# Patient Record
Sex: Female | Born: 1960 | Race: White | Hispanic: No | Smoking: Former smoker
Health system: Southern US, Community
[De-identification: ages and names within clinical notes are randomized; demographics above are authoritative.]

## PROBLEM LIST (undated history)

## (undated) DIAGNOSIS — E063 Autoimmune thyroiditis: Secondary | ICD-10-CM

## (undated) DIAGNOSIS — M797 Fibromyalgia: Secondary | ICD-10-CM

## (undated) DIAGNOSIS — G629 Polyneuropathy, unspecified: Secondary | ICD-10-CM

---
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
## 1992-03-17 HISTORY — PX: ABDOMINAL HYSTERECTOMY: SHX81

## 1998-01-03 ENCOUNTER — Encounter: Admission: RE | Admit: 1998-01-03 | Discharge: 1998-04-03 | Payer: Self-pay | Admitting: Family Medicine

## 1999-03-18 HISTORY — PX: TOTAL THYROIDECTOMY: SHX2547

## 1999-08-20 ENCOUNTER — Encounter: Payer: Self-pay | Admitting: Endocrinology

## 1999-08-20 ENCOUNTER — Ambulatory Visit (HOSPITAL_COMMUNITY): Admission: RE | Admit: 1999-08-20 | Discharge: 1999-08-20 | Payer: Self-pay | Admitting: Endocrinology

## 1999-11-13 ENCOUNTER — Ambulatory Visit (HOSPITAL_COMMUNITY): Admission: RE | Admit: 1999-11-13 | Discharge: 1999-11-13 | Payer: Self-pay | Admitting: Endocrinology

## 1999-11-13 ENCOUNTER — Encounter: Payer: Self-pay | Admitting: Endocrinology

## 1999-12-20 ENCOUNTER — Encounter: Payer: Self-pay | Admitting: Surgery

## 1999-12-26 ENCOUNTER — Observation Stay (HOSPITAL_COMMUNITY): Admission: RE | Admit: 1999-12-26 | Discharge: 1999-12-27 | Payer: Self-pay | Admitting: Surgery

## 1999-12-26 ENCOUNTER — Encounter (INDEPENDENT_AMBULATORY_CARE_PROVIDER_SITE_OTHER): Payer: Self-pay | Admitting: Specialist

## 2000-03-17 HISTORY — PX: CARPAL TUNNEL WITH CUBITAL TUNNEL: SHX5608

## 2000-11-26 ENCOUNTER — Encounter: Payer: Self-pay | Admitting: Family Medicine

## 2000-11-26 ENCOUNTER — Encounter: Admission: RE | Admit: 2000-11-26 | Discharge: 2000-11-26 | Payer: Self-pay | Admitting: Family Medicine

## 2002-12-22 ENCOUNTER — Encounter: Payer: Self-pay | Admitting: Internal Medicine

## 2002-12-22 ENCOUNTER — Encounter: Admission: RE | Admit: 2002-12-22 | Discharge: 2002-12-22 | Payer: Self-pay | Admitting: Internal Medicine

## 2003-01-04 ENCOUNTER — Encounter: Payer: Self-pay | Admitting: Internal Medicine

## 2003-01-04 ENCOUNTER — Encounter: Admission: RE | Admit: 2003-01-04 | Discharge: 2003-01-04 | Payer: Self-pay | Admitting: Internal Medicine

## 2003-01-16 ENCOUNTER — Ambulatory Visit (HOSPITAL_COMMUNITY): Admission: RE | Admit: 2003-01-16 | Discharge: 2003-01-16 | Payer: Self-pay | Admitting: Gastroenterology

## 2003-06-16 ENCOUNTER — Encounter: Admission: RE | Admit: 2003-06-16 | Discharge: 2003-06-16 | Payer: Self-pay | Admitting: Internal Medicine

## 2003-06-22 ENCOUNTER — Encounter: Admission: RE | Admit: 2003-06-22 | Discharge: 2003-06-22 | Payer: Self-pay | Admitting: Internal Medicine

## 2004-01-24 ENCOUNTER — Encounter: Admission: RE | Admit: 2004-01-24 | Discharge: 2004-01-24 | Payer: Self-pay | Admitting: Internal Medicine

## 2004-09-16 ENCOUNTER — Emergency Department (HOSPITAL_COMMUNITY): Admission: EM | Admit: 2004-09-16 | Discharge: 2004-09-17 | Payer: Self-pay | Admitting: Emergency Medicine

## 2004-12-16 ENCOUNTER — Encounter: Admission: RE | Admit: 2004-12-16 | Discharge: 2004-12-16 | Payer: Self-pay | Admitting: Internal Medicine

## 2005-03-19 ENCOUNTER — Encounter: Admission: RE | Admit: 2005-03-19 | Discharge: 2005-03-19 | Payer: Self-pay | Admitting: Internal Medicine

## 2006-03-20 ENCOUNTER — Encounter: Admission: RE | Admit: 2006-03-20 | Discharge: 2006-03-20 | Payer: Self-pay | Admitting: Family Medicine

## 2006-03-31 ENCOUNTER — Encounter: Admission: RE | Admit: 2006-03-31 | Discharge: 2006-03-31 | Payer: Self-pay | Admitting: Family Medicine

## 2006-07-14 ENCOUNTER — Encounter: Admission: RE | Admit: 2006-07-14 | Discharge: 2006-07-14 | Payer: Self-pay | Admitting: Endocrinology

## 2007-12-16 ENCOUNTER — Encounter: Admission: RE | Admit: 2007-12-16 | Discharge: 2007-12-16 | Payer: Self-pay | Admitting: Family Medicine

## 2008-01-20 ENCOUNTER — Encounter: Admission: RE | Admit: 2008-01-20 | Discharge: 2008-01-20 | Payer: Self-pay | Admitting: Otolaryngology

## 2008-12-20 ENCOUNTER — Encounter: Admission: RE | Admit: 2008-12-20 | Discharge: 2008-12-20 | Payer: Self-pay | Admitting: Family Medicine

## 2010-01-15 ENCOUNTER — Encounter: Admission: RE | Admit: 2010-01-15 | Discharge: 2010-01-15 | Payer: Self-pay | Admitting: Family Medicine

## 2010-07-18 IMAGING — MG MM SCREEN MAMMOGRAM BILATERAL
4 series · 4 of 4 positions shown · non-contrast
Comparison: none

DG SCREEN MAMMOGRAM BILATERAL
Bilateral CC and MLO view(s) were taken.
Technologist: Ausfluge, Valldet(JIM)

DIGITAL SCREENING MAMMOGRAM WITH CAD:
There are scattered fibroglandular densities.  No masses or malignant type calcifications are 
identified.  Compared with prior studies.

[R CC]
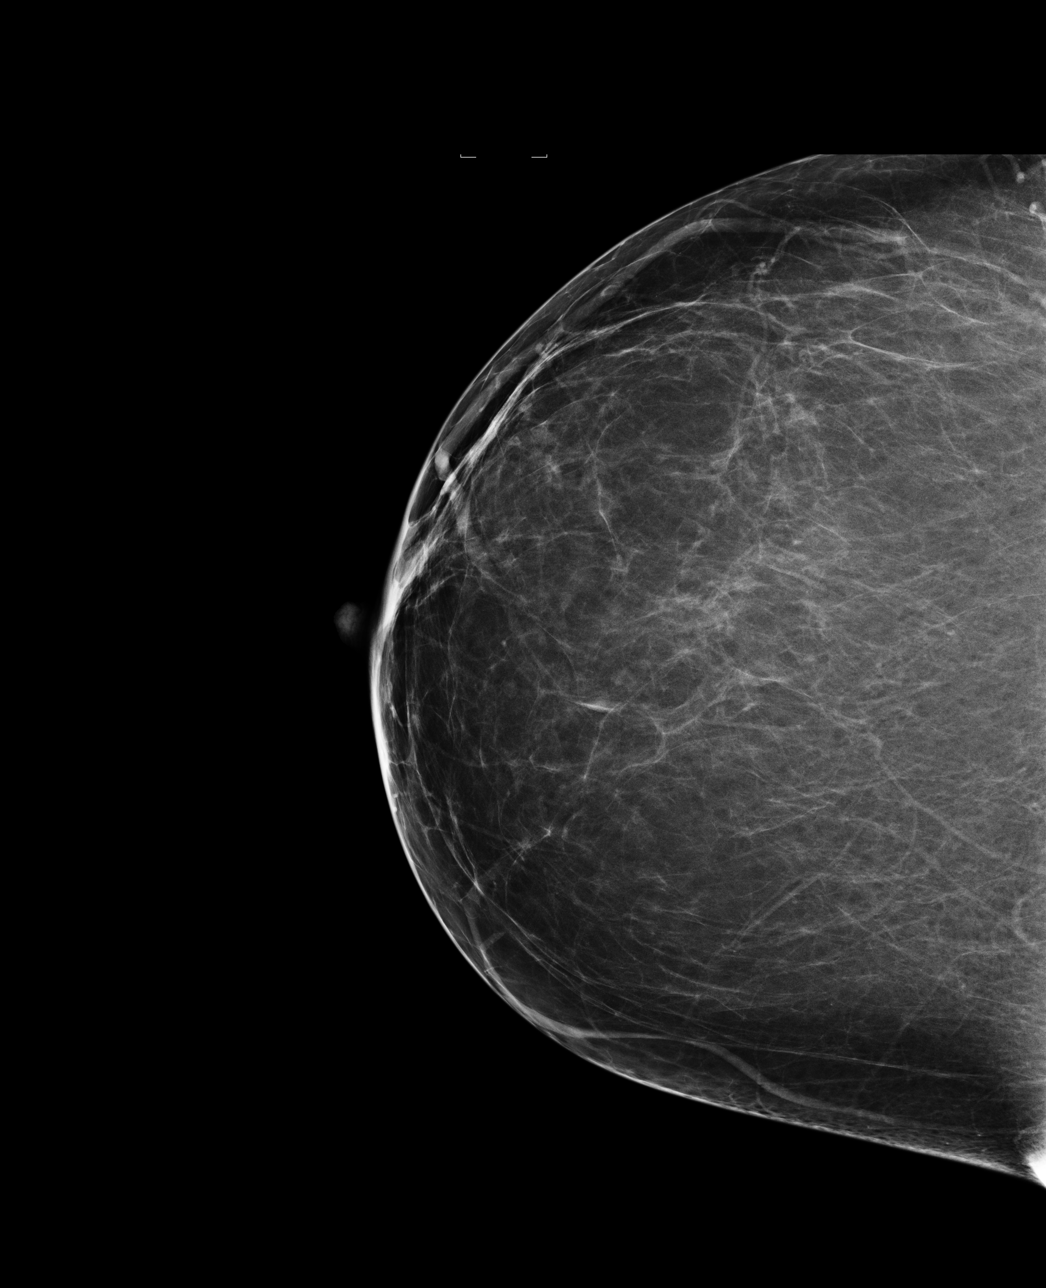

[L CC]
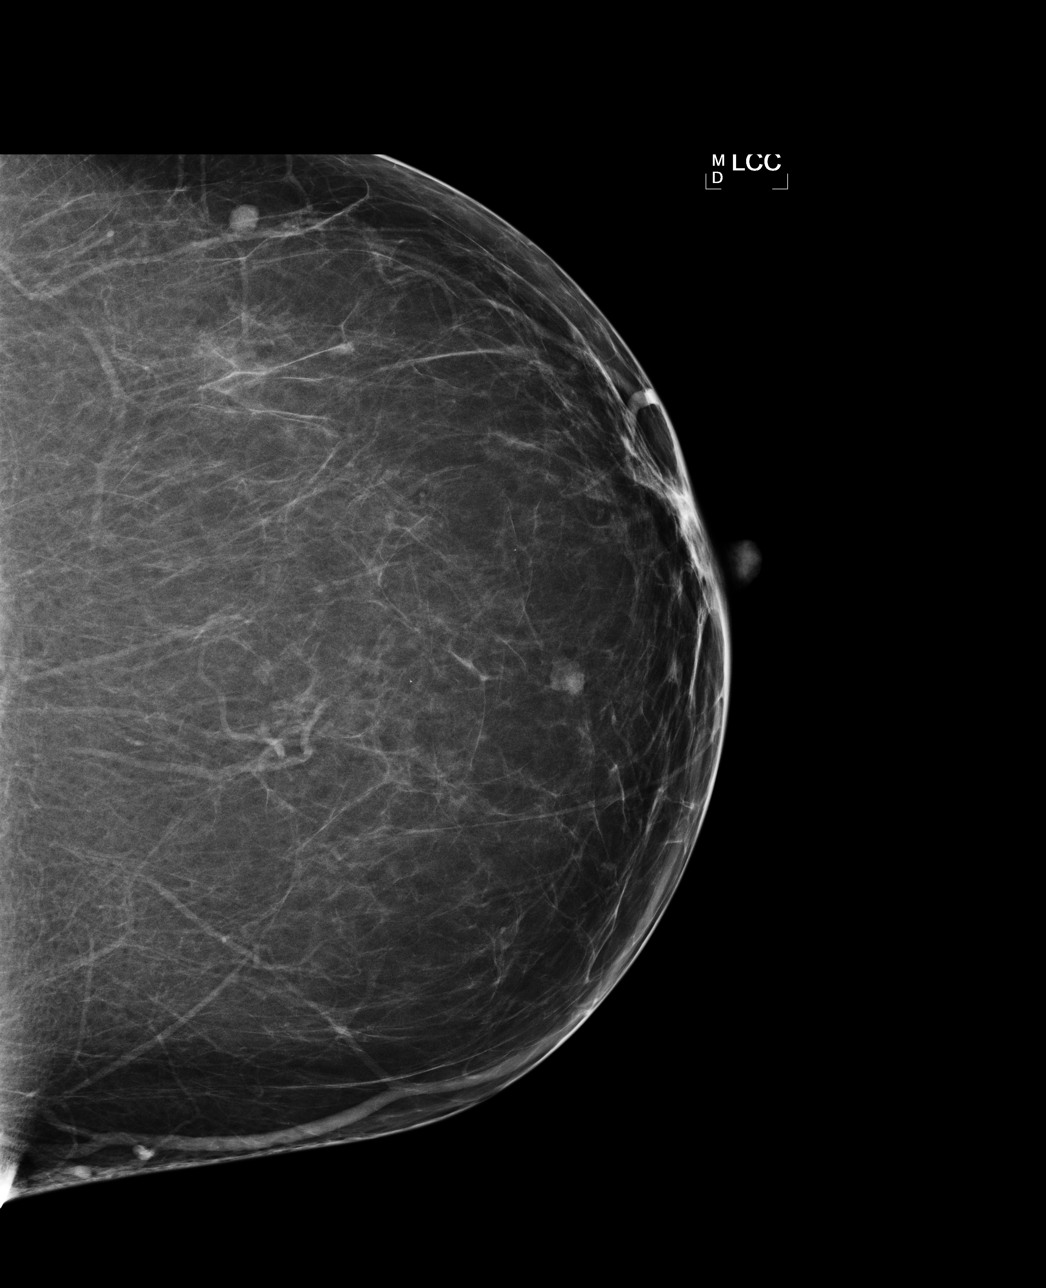

[L MLO]
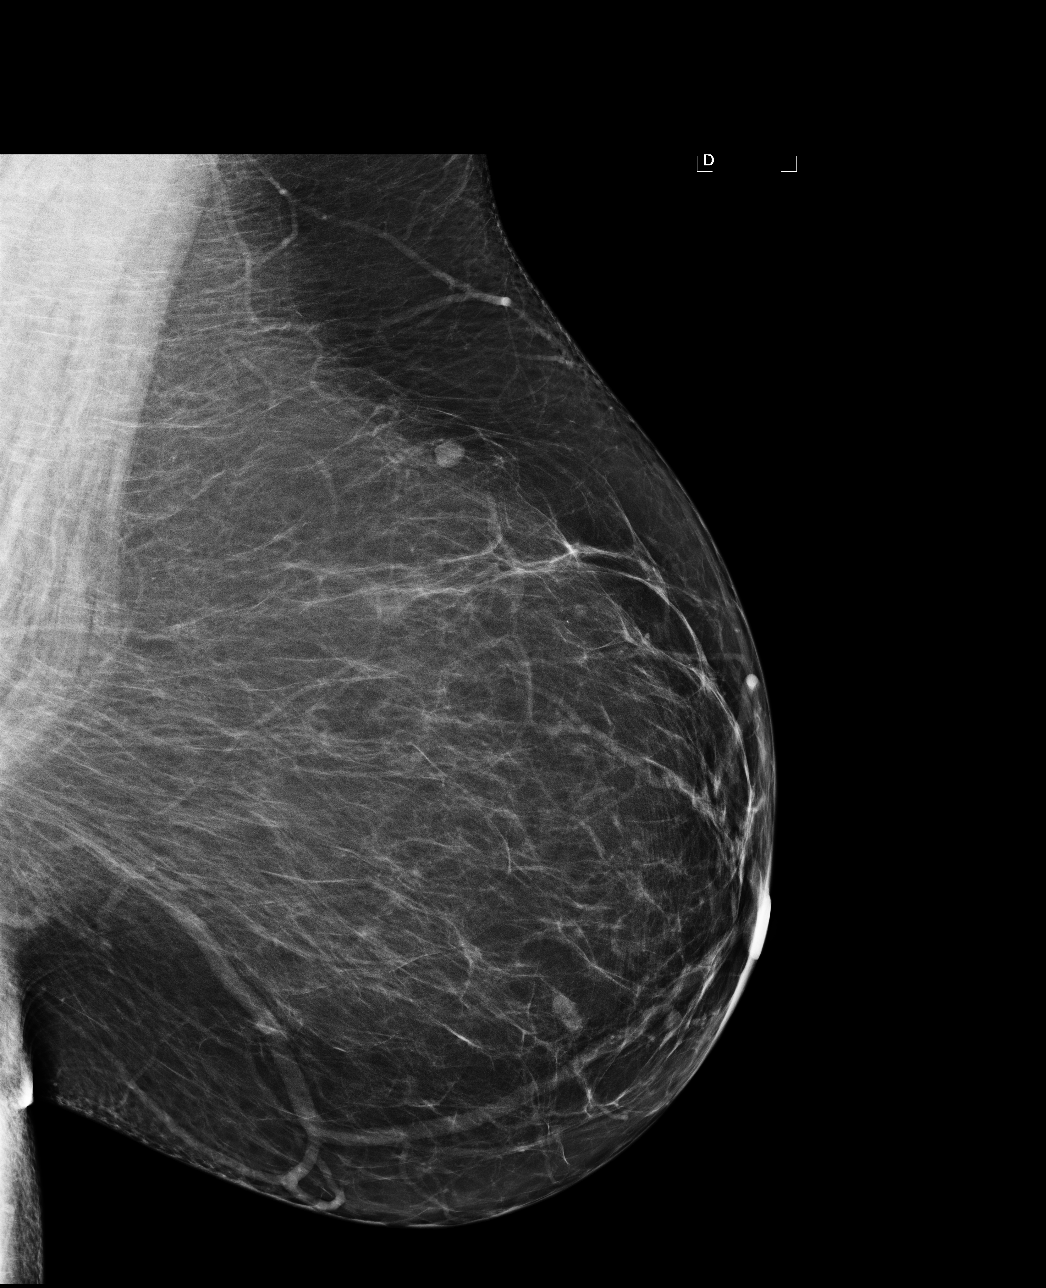

[R MLO]
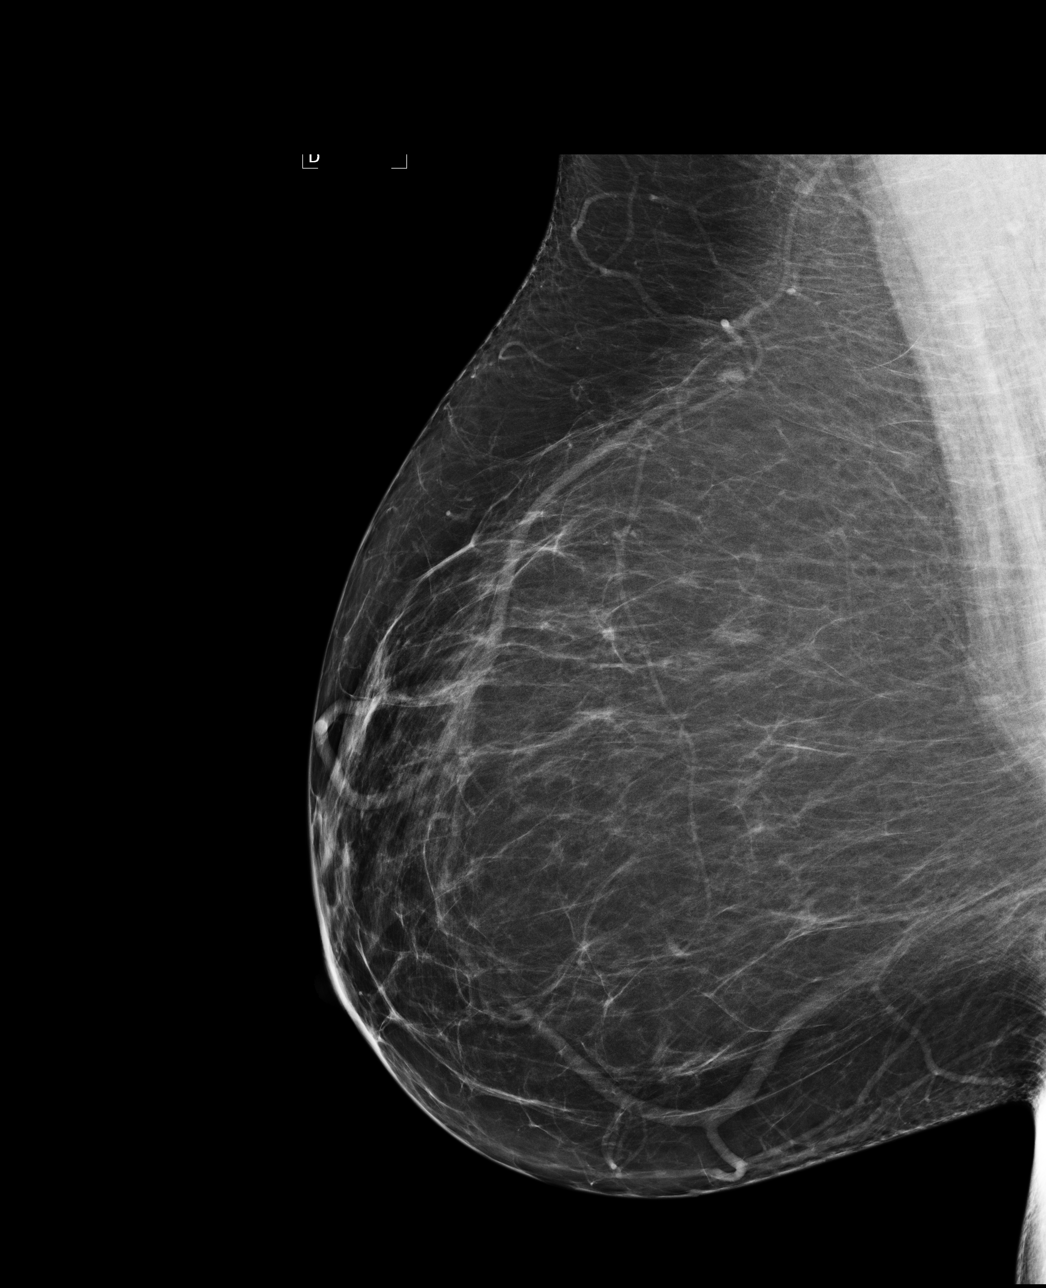

[4 of 4 positions shown; findings below may reference images not displayed]

IMPRESSION: No specific mammographic evidence of malignancy.  Next screening mammogram is recommended in one 
year.

ASSESSMENT: Negative - BI-RADS 1

Screening mammogram in 1 year.
ANALYZED BY COMPUTER AIDED DETECTION. , THIS PROCEDURE WAS A DIGITAL MAMMOGRAM.

## 2010-08-02 NOTE — Op Note (Signed)
[facility]  Patient:    Crystal Guzman, Crystal Guzman                    MRN: [id number] Proc. Date: 12/26/99 Adm. Date:  [id number] Attending:  Bonnetta Barry CCBernadene Person, M.D.  Lilyan Punt Sydnee Levans, M.D.   Operative Report  PREOPERATIVE DIAGNOSIS:  Thyroid goiter with compressive symptoms.  POSTOPERATIVE DIAGNOSIS:  Thyroid goiter with compressive symptoms.  PROCEDURE:  Total thyroidectomy.  SURGEON:  Velora Heckler, M.D.  ASSISTANT:  Chevis Pretty, M.D.  ANESTHESIA:  General.  ESTIMATED BLOOD LOSS:  Minimal.  PREPARATION:  Betadine.  COMPLICATIONS:  None.  INDICATIONS:  The patient is a 50 year old white female who presents at the request of Dr. Adela Lank and Dr. Aundria Rud with multinodular goiter. This had been diagnosed initially in 1994.  She has had thyroid ultrasound performed.  The patient complains of compressive symptoms including occasional choking, intolerance of tight clothing, snoring and vocal quality changes. The patient has been difficult to manage her hypothyroidism with replacement therapy.  She now comes to surgery for thyroidectomy.  BODY OF REPORT:  The procedure was done in OR #4 at [facility].  The patient was brought to the operating room and placed in the supine position on the operating room table.  Following the administration of general endotracheal anesthetic, the patient was positioned and then prepped and draped in the usual strict aseptic fashion.  After ascertaining that an adequate level of anesthesia had been obtained, a Kocher incision was made with a #10 blade.  Dissection was carried down through the subcutaneous tissues and platysma.  Hemostasis was obtained via electrocautery.  Skin flaps were developed cephalad and caudad from the thyroid notch to the sternal notch.  A ______  self retaining retractor was placed for exposure.  The strap muscles were then incised in  the midline.  Dissection was begun on the left side.  The strap muscles were reflected laterally.  The middle thyroid vein was identified and divided between medium Ligaclips.  The gland was dissected away from the adventitial tissues.  The inferior thyroid veins were divided between small and medium Ligaclips.  The gland was rolled anteriorly. The superior pole vessels were isolated and ligated between medium Ligaclips and with 2-0 silk ligatures.  The gland was rolled further anteriorly.  Care was taken to preserve parathyroid tissue.  The recurrent laryngeal nerve was preserved.  Branches of the inferior thyroid artery were divided between small Ligaclips.  The gland was rolled up and onto the anterior surface of the trachea, dividing the ligament of ______  with the electrocautery.  Venous tributaries coming into the isthmus from above were divided with small Ligaclips.  Next, we turned our attention to the right side.  Again, strap muscles were reflected laterally.  The middle thyroid vein was divided between Ligaclips.  The inferior pole venous tributaries were divided between Ligaclips.  The gland was rolled anteriorly.  The superior pole was mobilized. It was divided between hemostats and ligated with 2-0 silk ties.  The gland was rolled anteriorly.  The recurrent laryngeal nerve was identified. Parathyroid tissue was preserved.  The gland was rolled up and onto the anterior surface of the trachea.  The ligament of ______  was divided with the electrocautery.  The gland was then excised off of the anterior surface of the trachea and submitted en toto to pathology for review.  The right superior pole was notched with 2-0 silk suture for orientation purposes.  Good hemostasis was obtained with small Ligaclips.  The neck was irrigated. Surgicel was placed in both thyroid beds.  Good hemostasis was noted.  THe strap muscles were reapproximated in the midline with interrupted 3-0  Vicryl sutures.  The platysma was reapproximated with interrupted 3-0 Vicryl suture. The skin edges were reapproximated with stainless steel staples.  Benzoin and 1/2 inch Steri-Strips were applied between the staples.  A sterile gauze dressing was applied.  The patient was awakened from anesthesia and brought to the recovery room in stable condition.  The patient tolerated the procedure well. DD:  12/26/99 TD:  12/27/99 Job: [id number]

## 2010-08-02 NOTE — Op Note (Signed)
   NAME:  Crystal Guzman, Crystal Guzman                       ACCOUNT NO.:  [id number]   MEDICAL RECORD NO.:  [id number]                   PATIENT TYPE:  AMB   LOCATION:  ENDO                                 FACILITY:  [facility]   PHYSICIAN:  Graylin Shiver, M.D.                DATE OF BIRTH:  April 09, 1960   DATE OF PROCEDURE:  01/16/2003  DATE OF DISCHARGE:                                 OPERATIVE REPORT   PROCEDURE PERFORMED:  Colonoscopy.   INDICATIONS FOR PROCEDURE:  Right lower quadrant abdominal pain, family  history of colon cancer in the patient's mother.   Informed consent was obtained after explanation of the risks of bleeding,  infection, and perforation.   PREMEDICATIONS:  Fentanyl 130 mcg  IV, Versed 13 mg IV.   DESCRIPTION OF PROCEDURE:  With the patient in the left lateral decubitus  position, a rectal exam was performed and no masses were felt.  The Olympus  pediatric adjustable colonoscope was inserted into the rectum and advanced  around the colon to the cecum.  Cecal landmarks were identified.  The  ileocecal valve was intubated and the first few centimeters of terminal  ileum looked normal.  The cecum looked normal.  The ascending colon looked  normal.  The transverse colon looked normal.  The descending colon, sigmoid  and rectum looked normal.  The patient tolerated the procedure well without  complications.   IMPRESSION:  Normal colonoscopy to the cecum.   I would recommend a follow-up colonoscopy again in five years in view of the  family history of colon cancer in the patient's mother.                                               Graylin Shiver, M.D.    SFG/MEDQ  D:  01/16/2003  T:  01/16/2003  Job:  [id number]   cc:   Sharlet Salina, M.D.  [address]  [place]  [place] [address]  Fax: [phone]

## 2010-12-03 ENCOUNTER — Other Ambulatory Visit: Payer: Self-pay | Admitting: Family Medicine

## 2010-12-03 DIAGNOSIS — Z1231 Encounter for screening mammogram for malignant neoplasm of breast: Secondary | ICD-10-CM

## 2011-01-17 ENCOUNTER — Ambulatory Visit
Admission: RE | Admit: 2011-01-17 | Discharge: 2011-01-17 | Disposition: A | Discharge status: Home / Self Care | Payer: 59 | Source: Ambulatory Visit | Attending: Family Medicine | Admitting: Family Medicine

## 2011-01-17 DIAGNOSIS — Z1231 Encounter for screening mammogram for malignant neoplasm of breast: Secondary | ICD-10-CM

## 2012-06-02 ENCOUNTER — Other Ambulatory Visit: Payer: Self-pay | Admitting: Family Medicine

## 2012-06-02 DIAGNOSIS — Z1231 Encounter for screening mammogram for malignant neoplasm of breast: Secondary | ICD-10-CM

## 2012-06-21 ENCOUNTER — Ambulatory Visit: Payer: 59

## 2012-06-24 DIAGNOSIS — E559 Vitamin D deficiency, unspecified: Secondary | ICD-10-CM | POA: Insufficient documentation

## 2012-06-24 DIAGNOSIS — L501 Idiopathic urticaria: Secondary | ICD-10-CM | POA: Insufficient documentation

## 2012-06-24 DIAGNOSIS — E782 Mixed hyperlipidemia: Secondary | ICD-10-CM | POA: Insufficient documentation

## 2012-06-24 DIAGNOSIS — F411 Generalized anxiety disorder: Secondary | ICD-10-CM | POA: Insufficient documentation

## 2012-06-24 DIAGNOSIS — M545 Low back pain, unspecified: Secondary | ICD-10-CM | POA: Insufficient documentation

## 2012-06-24 DIAGNOSIS — M797 Fibromyalgia: Secondary | ICD-10-CM | POA: Insufficient documentation

## 2012-12-29 ENCOUNTER — Ambulatory Visit
Admission: RE | Admit: 2012-12-29 | Discharge: 2012-12-29 | Disposition: A | Discharge status: Home / Self Care | Payer: BC Managed Care – PPO | Source: Ambulatory Visit | Attending: Family Medicine | Admitting: Family Medicine

## 2012-12-29 DIAGNOSIS — Z1231 Encounter for screening mammogram for malignant neoplasm of breast: Secondary | ICD-10-CM

## 2013-01-17 DIAGNOSIS — G4700 Insomnia, unspecified: Secondary | ICD-10-CM | POA: Insufficient documentation

## 2013-01-17 DIAGNOSIS — G609 Hereditary and idiopathic neuropathy, unspecified: Secondary | ICD-10-CM | POA: Insufficient documentation

## 2013-01-17 DIAGNOSIS — I10 Essential (primary) hypertension: Secondary | ICD-10-CM | POA: Insufficient documentation

## 2013-01-17 DIAGNOSIS — E669 Obesity, unspecified: Secondary | ICD-10-CM | POA: Insufficient documentation

## 2013-01-17 DIAGNOSIS — L680 Hirsutism: Secondary | ICD-10-CM | POA: Insufficient documentation

## 2013-12-23 ENCOUNTER — Other Ambulatory Visit: Payer: Self-pay

## 2013-12-23 DIAGNOSIS — Z1239 Encounter for other screening for malignant neoplasm of breast: Secondary | ICD-10-CM

## 2014-01-02 ENCOUNTER — Ambulatory Visit
Admission: RE | Admit: 2014-01-02 | Discharge: 2014-01-02 | Disposition: A | Discharge status: Home / Self Care | Payer: BC Managed Care – PPO | Source: Ambulatory Visit

## 2014-01-02 DIAGNOSIS — Z1239 Encounter for other screening for malignant neoplasm of breast: Secondary | ICD-10-CM

## 2014-10-26 DIAGNOSIS — E039 Hypothyroidism, unspecified: Secondary | ICD-10-CM | POA: Insufficient documentation

## 2014-12-07 ENCOUNTER — Other Ambulatory Visit: Payer: Self-pay

## 2014-12-07 DIAGNOSIS — Z1231 Encounter for screening mammogram for malignant neoplasm of breast: Secondary | ICD-10-CM

## 2015-01-05 ENCOUNTER — Ambulatory Visit: Payer: BC Managed Care – PPO

## 2016-08-26 ENCOUNTER — Other Ambulatory Visit: Payer: Self-pay | Admitting: Family Medicine

## 2016-08-26 DIAGNOSIS — Z1231 Encounter for screening mammogram for malignant neoplasm of breast: Secondary | ICD-10-CM

## 2016-12-22 ENCOUNTER — Ambulatory Visit
Admission: RE | Admit: 2016-12-22 | Discharge: 2016-12-22 | Disposition: A | Discharge status: Home / Self Care | Payer: BLUE CROSS/BLUE SHIELD | Source: Ambulatory Visit | Attending: Family Medicine | Admitting: Family Medicine

## 2016-12-22 DIAGNOSIS — Z1231 Encounter for screening mammogram for malignant neoplasm of breast: Secondary | ICD-10-CM

## 2017-11-12 ENCOUNTER — Other Ambulatory Visit: Payer: Self-pay | Admitting: Family Medicine

## 2017-11-12 DIAGNOSIS — Z1231 Encounter for screening mammogram for malignant neoplasm of breast: Secondary | ICD-10-CM

## 2017-12-24 ENCOUNTER — Ambulatory Visit
Admission: RE | Admit: 2017-12-24 | Discharge: 2017-12-24 | Disposition: A | Discharge status: Home / Self Care | Payer: BLUE CROSS/BLUE SHIELD | Source: Ambulatory Visit | Attending: Family Medicine | Admitting: Family Medicine

## 2017-12-24 DIAGNOSIS — Z1231 Encounter for screening mammogram for malignant neoplasm of breast: Secondary | ICD-10-CM

## 2018-11-23 ENCOUNTER — Other Ambulatory Visit: Payer: Self-pay | Admitting: Family Medicine

## 2018-11-23 DIAGNOSIS — Z1231 Encounter for screening mammogram for malignant neoplasm of breast: Secondary | ICD-10-CM

## 2019-01-05 ENCOUNTER — Other Ambulatory Visit: Payer: Self-pay

## 2019-01-05 ENCOUNTER — Ambulatory Visit
Admission: RE | Admit: 2019-01-05 | Discharge: 2019-01-05 | Disposition: A | Discharge status: Home / Self Care | Payer: BLUE CROSS/BLUE SHIELD | Source: Ambulatory Visit | Attending: Family Medicine | Admitting: Family Medicine

## 2019-01-05 DIAGNOSIS — Z1231 Encounter for screening mammogram for malignant neoplasm of breast: Secondary | ICD-10-CM

## 2019-11-06 NOTE — Progress Notes (Signed)
Complete Physical  Assessment and Plan:  Hypertension, benign - continue medications, DASH diet, exercise and monitor at home. Call if greater than 130/80.  -     CBC with Differential/Platelet -     COMPLETE METABOLIC PANEL WITH GFR -     Urinalysis, Routine w reflex microscopic -     Microalbumin / creatinine urine ratio -     EKG 12-Lead  Hypothyroidism (acquired) Hypothyroidism-check TSH level, continue medications the same, reminded to take on an empty stomach 30-61mins before food.  ? Need brand name, cytomel? -     TSH -     T4, free -     Testosterone, Total, LC/MS/MS  Mixed hyperlipidemia -     Lipid panel check lipids decrease fatty foods increase activity.   Vitamin D deficiency -     VITAMIN D 25 Hydroxy (Vit-D Deficiency, Fractures)  Hirsutism Continue spirolactone- will check labs  Fibromyalgia Monitor, increase exercise  Insomnia, unspecified type Insomnia- good sleep hygiene discussed, increase day time activity, try melatonin or benadryl if this does not help we will call in sleep medication.   Hereditary and idiopathic peripheral neuropathy No evidence on exam  Idiopathic urticaria Monitor, add zyrtec  Anemia, unspecified type -     Iron,Total/Total Iron Binding Cap -     Vitamin B12  Anxiety state Monitor  Insulin resistance -     Hemoglobin A1c  Medication management -     Magnesium  Need for diphtheria-tetanus-pertussis (Tdap) vaccine -     Tdap vaccine greater than or equal to 7yo IM   Discussed med's effects and SE's. Screening labs and tests as requested with regular follow-up as recommended. Over 40 minutes of exam, counseling, chart review, and complex, high level critical decision making was performed this visit.   HPI  59 y.o. female  presents for a complete physical and follow up for has Anxiety state; Fibromyalgia; Hereditary and idiopathic peripheral neuropathy; Hirsutism; Hypertension, benign; Hypothyroidism (acquired);  Idiopathic urticaria; Insomnia; Low back pain; Mixed hyperlipidemia; Obesity; and Vitamin D deficiency on their problem list..  She switched insurance so needed new PCP.  Has 2 kids, daughter and son. She is divorced. [place].   Her blood pressure has been controlled at home, today their BP is BP: 140/82 She does not workout. She denies chest pain, shortness of breath, dizziness.   She has been having a lot of hair loss, had elevated testosterone, started on spirolactone and metformin for hair loss.   She is on thyroid medication. She was on armour thyroid previously, she has switched 1.5 years ago to levothyroxine and states she has been tired and not feeling well since that time. She was on armour thyroid before.  She has fatigue, hair loss, trouble with weight loss, cold intolerance.  She has hashimoto's s/p thyroidectomy.   BMI is Body mass index is 29.86 kg/m., she is working on diet and exercise. She states in the last year she has been having trouble with losing weight, interested in [place].  Wt Readings from Last 3 Encounters:  11/07/19 172 lb 9.6 oz (78.3 kg)     Current Medications:   Current Outpatient Medications (Endocrine & Metabolic):  .  levothyroxine (SYNTHROID) 88 MCG tablet, Take 1 tablet by mouth daily. .  metFORMIN (GLUCOPHAGE-XR) 500 MG 24 hr tablet, TAKE 1 TABLET BY MOUTH ONCE DAILY WITH  DINNER  Current Outpatient Medications (Cardiovascular):  .  atenolol (TENORMIN) 25 MG tablet, Take by mouth. Marland Kitchen  spironolactone (ALDACTONE) 50 MG tablet, Take by mouth.   Current Outpatient Medications (Analgesics):  .  aspirin 81 MG chewable tablet, Chew 1 tablet by mouth daily.  Current Outpatient Medications (Hematological):  Marland Kitchen  Cyanocobalamin (B-12 SL), Place under the tongue. .  ferrous sulfate 325 (65 FE) MG tablet, Take 1 tablet by mouth every other day.  Current Outpatient Medications (Other):  Marland Kitchen  ALPRAZolam (XANAX) 1 MG tablet, Take by mouth at  bedtime as needed.  .  Ascorbic Acid (VITAMIN C) 1000 MG tablet, Take 1,000 mg by mouth every other day. .  Calcium Carb-Cholecalciferol (CALCIUM 600-D PO), Take 1 capsule by mouth daily. .  Cholecalciferol 125 MCG (5000 UT) TABS, Take 2,000 Units by mouth.  .  cyclobenzaprine (FLEXERIL) 10 MG tablet, Take by mouth as needed.  Tery Sanfilippo Calcium (STOOL SOFTENER PO), Take by mouth. .  Multiple Vitamin (MULTIVITAMIN) tablet, Take 1 tablet by mouth daily.  Allergies:  Not on File   Medical History:  She has Anxiety state; Fibromyalgia; Hereditary and idiopathic peripheral neuropathy; Hirsutism; Hypertension, benign; Hypothyroidism (acquired); Idiopathic urticaria; Insomnia; Low back pain; Mixed hyperlipidemia; Obesity; and Vitamin D deficiency on their problem list.   Health Maintenance:   Immunization History  Administered Date(s) Administered  . Influenza-Unspecified 11/29/2018  . Tdap 03/30/2018, 11/07/2019  . Zoster Recombinat (Shingrix) 11/12/2016, 01/21/2017   Health Maintenance  Topic Date Due  . Hepatitis C Screening  Never done  . COVID-19 Vaccine (1) Never done  . HIV Screening  Never done  . PAP SMEAR-Modifier  Never done  . COLONOSCOPY  Never done  . INFLUENZA VACCINE  10/16/2019  . MAMMOGRAM  01/04/2021  . TETANUS/TDAP  11/06/2029   Tetanus: 03/30/2018 Unknown if got- will give today Pneumovax: declines Prevnar 13: declines Flu vaccine: suggest getting with dad living with her shingrix done   [facility] Pap: s/p hysterectomy 1994 MGM: 12/24/2017 DEXA:  Colonoscopy: 06/07/2014 due 5 years EGD:  Last Dental Exam: every 6 months Last Eye Exam:March 2nd 2021 Dr. Carlynn Purl Patient Care Team: Lucky Cowboy, MD as PCP - General (Internal Medicine)  Surgical History:  She has a past surgical history that includes Abdominal hysterectomy (1994); Total thyroidectomy (2001); Tonsilectomy/adenoidectomy with myringotomy (1967); and Carpal tunnel with  cubital tunnel (Bilateral, 2002). Family History:  Herfamily history includes Angina in her mother; Arthritis in her father and mother; Colitis in her mother; Colon cancer in her mother; Gout in her father; Heart attack in her father; Heart disease in her father; Hypertension in her father and mother; Lung cancer in her mother; Migraines in her father; Stroke in her father; Tuberculosis in her sister. Social History:  She   Review of Systems: ROS see HPI  Physical Exam: Estimated body mass index is 29.86 kg/m as calculated from the following:   Height as of this encounter: 5' 3.75" (1.619 m).   Weight as of this encounter: 172 lb 9.6 oz (78.3 kg). BP 140/82   Pulse (!) 51   Temp 98.1 F (36.7 C)   Ht 5' 3.75" (1.619 m)   Wt 172 lb 9.6 oz (78.3 kg)   SpO2 95%   BMI 29.86 kg/m  General Appearance: Well nourished, in no apparent distress.  Eyes: PERRLA, EOMs, conjunctiva no swelling or erythema, normal fundi and vessels.  Sinuses: No Frontal/maxillary tenderness  ENT/Mouth: Ext aud canals clear, normal light reflex with TMs without erythema, bulging. Good dentition. No erythema, swelling, or exudate on post pharynx. Tonsils  not swollen or erythematous. Hearing normal.  Neck: Supple, thyroid normal. No bruits  Respiratory: Respiratory effort normal, BS equal bilaterally without rales, rhonchi, wheezing or stridor.  Cardio: RRR without murmurs, rubs or gallops. Brisk peripheral pulses without edema.  Chest: symmetric, with normal excursions and percussion.  Breasts: Symmetric, without lumps, nipple discharge, retractions.  Abdomen: Soft, nontender, no guarding, rebound, hernias, masses, or organomegaly.  Lymphatics: Non tender without lymphadenopathy.  Genitourinary:  Musculoskeletal: Full ROM all peripheral extremities,5/5 strength, and normal gait.  Skin: Warm, dry without rashes, lesions, ecchymosis. Neuro: Cranial nerves intact, reflexes equal bilaterally. Normal muscle tone, no  cerebellar symptoms. Sensation intact.  Psych: Awake and oriented X 3, normal affect, Insight and Judgment appropriate.   EKG: WNL no ST changes. AORTA SCAN: defer  Quentin Mulling 4:35 PM [facility]

## 2019-11-07 ENCOUNTER — Other Ambulatory Visit: Payer: Self-pay

## 2019-11-07 ENCOUNTER — Ambulatory Visit: Payer: 59 | Admitting: Physician Assistant

## 2019-11-07 ENCOUNTER — Encounter: Payer: Self-pay | Admitting: Physician Assistant

## 2019-11-07 VITALS — BP 140/82 | HR 51 | Temp 98.1°F | Ht 63.75 in | Wt 172.6 lb

## 2019-11-07 DIAGNOSIS — Z0000 Encounter for general adult medical examination without abnormal findings: Secondary | ICD-10-CM

## 2019-11-07 DIAGNOSIS — I10 Essential (primary) hypertension: Secondary | ICD-10-CM

## 2019-11-07 DIAGNOSIS — M797 Fibromyalgia: Secondary | ICD-10-CM

## 2019-11-07 DIAGNOSIS — Z1389 Encounter for screening for other disorder: Secondary | ICD-10-CM

## 2019-11-07 DIAGNOSIS — E8881 Metabolic syndrome: Secondary | ICD-10-CM

## 2019-11-07 DIAGNOSIS — Z131 Encounter for screening for diabetes mellitus: Secondary | ICD-10-CM

## 2019-11-07 DIAGNOSIS — E559 Vitamin D deficiency, unspecified: Secondary | ICD-10-CM

## 2019-11-07 DIAGNOSIS — Z79899 Other long term (current) drug therapy: Secondary | ICD-10-CM

## 2019-11-07 DIAGNOSIS — F411 Generalized anxiety disorder: Secondary | ICD-10-CM

## 2019-11-07 DIAGNOSIS — Z23 Encounter for immunization: Secondary | ICD-10-CM | POA: Diagnosis not present

## 2019-11-07 DIAGNOSIS — Z130 Encounter for screening for diseases of the blood and blood-forming organs and certain disorders involving the immune mechanism: Secondary | ICD-10-CM

## 2019-11-07 DIAGNOSIS — E88819 Insulin resistance, unspecified: Secondary | ICD-10-CM

## 2019-11-07 DIAGNOSIS — Z136 Encounter for screening for cardiovascular disorders: Secondary | ICD-10-CM | POA: Diagnosis not present

## 2019-11-07 DIAGNOSIS — G609 Hereditary and idiopathic neuropathy, unspecified: Secondary | ICD-10-CM

## 2019-11-07 DIAGNOSIS — D649 Anemia, unspecified: Secondary | ICD-10-CM

## 2019-11-07 DIAGNOSIS — L680 Hirsutism: Secondary | ICD-10-CM

## 2019-11-07 DIAGNOSIS — Z13220 Encounter for screening for lipoid disorders: Secondary | ICD-10-CM

## 2019-11-07 DIAGNOSIS — E782 Mixed hyperlipidemia: Secondary | ICD-10-CM

## 2019-11-07 DIAGNOSIS — E039 Hypothyroidism, unspecified: Secondary | ICD-10-CM

## 2019-11-07 DIAGNOSIS — L501 Idiopathic urticaria: Secondary | ICD-10-CM

## 2019-11-07 DIAGNOSIS — G4700 Insomnia, unspecified: Secondary | ICD-10-CM

## 2019-11-07 NOTE — Patient Instructions (Signed)
Will see where we land with your thyroid May add cytomel or switch to brand name thyroid  If your thyroid is normal We can try the wegovy- you will get samples and card here.    [facility], [facility], and [facility] seem to have a good system down so I suggest taking it to one of those pharamcy's.   We will start you on the 0.25mg  once weekly for 4 weeks- GIVEN SAMPLES Then you can do 0.5mg  once weekly for 4 weeks Then you can do the 1 mg once weekly for 4 weeks Then you can do 1.7mg  once weekly for 4 weeks Then you can do 2.4mg  once weekly and stay on this dose.   If you get nausea stay on the tolerated dose x 1-2 more weeks and then try to move up  The main issues with this is the nausea Eat small frequent meals and avoid fatty foods.   General eating tips  What to Avoid . Avoid added sugars o Often added sugar can be found in processed foods such as many condiments, dry cereals, cakes, cookies, chips, crisps, crackers, candies, sweetened drinks, etc.  o Read labels and AVOID/DECREASE use of foods with the following in their ingredient list: Sugar, fructose, high fructose corn syrup, sucrose, glucose, maltose, dextrose, molasses, cane sugar, brown sugar, any type of syrup, agave nectar, etc.   . Avoid snacking in between meals- drink water or if you feel you need a snack, pick a high water content snack such as cucumbers, watermelon, or any veggie.  Marland Kitchen Avoid foods made with flour o If you are going to eat food made with flour, choose those made with whole-grains; and, minimize your consumption as much as is tolerable . Avoid processed foods o These foods are generally stocked in the middle of the grocery store.  o Focus on shopping on the perimeter of the grocery.  What to Include . Vegetables o GREEN LEAFY VEGETABLES: Kale, spinach, mustard greens, collard greens, cabbage, broccoli, etc. o OTHER: Asparagus, cauliflower, eggplant, carrots, peas, Brussel sprouts, tomatoes, bell peppers,  zucchini, beets, cucumbers, etc. . Grains, seeds, and legumes o Beans: kidney beans, black eyed peas, garbanzo beans, black beans, pinto beans, etc. o Whole, unrefined grains: brown rice, barley, bulgur, oatmeal, etc. . Healthy fats  o Avoid highly processed fats such as vegetable oil o Examples of healthy fats: avocado, olives, virgin olive oil, dark chocolate (?72% Cocoa), nuts (peanuts, almonds, walnuts, cashews, pecans, etc.) o Please still do small amount of these healthy fats, they are dense in calories.  . Low - Moderate Intake of Animal Sources of Protein o Meat sources: chicken, [place], salmon, tuna. Limit to 4 ounces of meat at one time or the size of your palm. o Consider limiting dairy sources, but when choosing dairy focus on: PLAIN [place] yogurt, cottage cheese, high-protein milk . Fruit o Choose berries

## 2019-11-09 LAB — CBC WITH DIFFERENTIAL/PLATELET
Absolute Monocytes: 669 cells/uL (ref 200–950)
Basophils Absolute: 38 cells/uL (ref 0–200)
Basophils Relative: 0.5 %
Eosinophils Absolute: 53 cells/uL (ref 15–500)
Eosinophils Relative: 0.7 %
HCT: 45.4 % — ABNORMAL HIGH (ref 35.0–45.0)
Hemoglobin: 14.9 g/dL (ref 11.7–15.5)
Lymphs Abs: 2409 cells/uL (ref 850–3900)
MCH: 29.5 pg (ref 27.0–33.0)
MCHC: 32.8 g/dL (ref 32.0–36.0)
MCV: 89.9 fL (ref 80.0–100.0)
MPV: 9.7 fL (ref 7.5–12.5)
Monocytes Relative: 8.8 %
Neutro Abs: 4431 cells/uL (ref 1500–7800)
Neutrophils Relative %: 58.3 %
Platelets: 499 10*3/uL — ABNORMAL HIGH (ref 140–400)
RBC: 5.05 10*6/uL (ref 3.80–5.10)
RDW: 12.1 % (ref 11.0–15.0)
Total Lymphocyte: 31.7 %
WBC: 7.6 10*3/uL (ref 3.8–10.8)

## 2019-11-09 LAB — COMPLETE METABOLIC PANEL WITH GFR
AG Ratio: 1.7 (calc) (ref 1.0–2.5)
ALT: 13 U/L (ref 6–29)
AST: 16 U/L (ref 10–35)
Albumin: 4.5 g/dL (ref 3.6–5.1)
Alkaline phosphatase (APISO): 79 U/L (ref 37–153)
BUN: 10 mg/dL (ref 7–25)
CO2: 32 mmol/L (ref 20–32)
Calcium: 10 mg/dL (ref 8.6–10.4)
Chloride: 101 mmol/L (ref 98–110)
Creat: 0.75 mg/dL (ref 0.50–0.99)
GFR, Est African American: 100 mL/min/{1.73_m2} (ref >=60)
GFR, Est Non African American: 86 mL/min/{1.73_m2} (ref >=60)
Globulin: 2.7 g/dL (calc) (ref 1.9–3.7)
Glucose, Bld: 95 mg/dL (ref 65–99)
Potassium: 5.1 mmol/L (ref 3.5–5.3)
Sodium: 138 mmol/L (ref 135–146)
Total Bilirubin: 0.3 mg/dL (ref 0.2–1.2)
Total Protein: 7.2 g/dL (ref 6.1–8.1)

## 2019-11-09 LAB — LIPID PANEL
Cholesterol: 259 mg/dL — ABNORMAL HIGH (ref <200)
HDL: 50 mg/dL (ref >=50)
LDL Cholesterol (Calc): 166 mg/dL (calc) — ABNORMAL HIGH
Non-HDL Cholesterol (Calc): 209 mg/dL (calc) — ABNORMAL HIGH (ref <130)
Total CHOL/HDL Ratio: 5.2 (calc) — ABNORMAL HIGH (ref <5.0)
Triglycerides: 262 mg/dL — ABNORMAL HIGH (ref <150)

## 2019-11-09 LAB — URINALYSIS, ROUTINE W REFLEX MICROSCOPIC
Bilirubin Urine: NEGATIVE
Glucose, UA: NEGATIVE
Hgb urine dipstick: NEGATIVE
Ketones, ur: NEGATIVE
Leukocytes,Ua: NEGATIVE
Nitrite: NEGATIVE
Protein, ur: NEGATIVE
Specific Gravity, Urine: 1.017 (ref 1.001–1.03)
pH: <=5 (ref 5.0–8.0)

## 2019-11-09 LAB — VITAMIN B12: Vitamin B-12: 454 pg/mL (ref 200–1100)

## 2019-11-09 LAB — HEMOGLOBIN A1C
Hgb A1c MFr Bld: 5.8 % of total Hgb — ABNORMAL HIGH (ref <5.7)
Mean Plasma Glucose: 120 (calc)
eAG (mmol/L): 6.6 (calc)

## 2019-11-09 LAB — TSH: TSH: 1.69 mIU/L (ref 0.40–4.50)

## 2019-11-09 LAB — T4, FREE: Free T4: 1.3 ng/dL (ref 0.8–1.8)

## 2019-11-09 LAB — IRON, TOTAL/TOTAL IRON BINDING CAP
%SAT: 17 % (calc) (ref 16–45)
Iron: 69 ug/dL (ref 45–160)
TIBC: 397 mcg/dL (calc) (ref 250–450)

## 2019-11-09 LAB — TESTOSTERONE, TOTAL, LC/MS/MS: Testosterone, Total, LC-MS-MS: 51 ng/dL — ABNORMAL HIGH (ref 2–45)

## 2019-11-09 LAB — VITAMIN D 25 HYDROXY (VIT D DEFICIENCY, FRACTURES): Vit D, 25-Hydroxy: 78 ng/mL (ref 30–100)

## 2019-11-09 LAB — MAGNESIUM: Magnesium: 2 mg/dL (ref 1.5–2.5)

## 2019-11-09 LAB — MICROALBUMIN / CREATININE URINE RATIO
Creatinine, Urine: 125 mg/dL (ref 20–275)
Microalb Creat Ratio: 3 mcg/mg creat (ref <30)
Microalb, Ur: 0.4 mg/dL

## 2019-11-11 NOTE — Progress Notes (Signed)
Patient is aware of lab results and instructions. She would like the Branded Synthroid and the Spirolocatone 100mg  sent in to on [facility].   [facility] [employer]

## 2019-11-13 ENCOUNTER — Telehealth: Payer: Self-pay | Admitting: Physician Assistant

## 2019-11-13 DIAGNOSIS — L680 Hirsutism: Secondary | ICD-10-CM

## 2019-11-13 MED ORDER — SPIRONOLACTONE 100 MG PO TABS: 100.0000 mg | ORAL_TABLET | Freq: Every day | ORAL | 0 refills | Status: DC

## 2019-11-13 NOTE — Telephone Encounter (Signed)
Patient will need to come into the office to pick up the synthroid samples.  She will need a follow up for lab recheck of kidney function with increase in the spriolactone- 2 weeks

## 2019-11-14 ENCOUNTER — Telehealth: Payer: Self-pay | Admitting: Physician Assistant

## 2019-11-14 NOTE — Telephone Encounter (Signed)
requesting rx for brand name synthroid to [facility]- please advise patient if your instructions are different.

## 2019-11-28 ENCOUNTER — Other Ambulatory Visit: Payer: Self-pay

## 2019-11-28 ENCOUNTER — Other Ambulatory Visit: Payer: 59

## 2019-11-28 DIAGNOSIS — R0600 Dyspnea, unspecified: Secondary | ICD-10-CM

## 2019-11-28 DIAGNOSIS — E039 Hypothyroidism, unspecified: Secondary | ICD-10-CM

## 2019-11-28 DIAGNOSIS — Z79899 Other long term (current) drug therapy: Secondary | ICD-10-CM

## 2019-11-28 DIAGNOSIS — R0902 Hypoxemia: Secondary | ICD-10-CM

## 2019-11-29 LAB — COMPLETE METABOLIC PANEL WITH GFR
AG Ratio: 2 (calc) (ref 1.0–2.5)
ALT: 9 U/L (ref 6–29)
AST: 16 U/L (ref 10–35)
Albumin: 4.7 g/dL (ref 3.6–5.1)
Alkaline phosphatase (APISO): 71 U/L (ref 37–153)
BUN: 11 mg/dL (ref 7–25)
CO2: 31 mmol/L (ref 20–32)
Calcium: 10.2 mg/dL (ref 8.6–10.4)
Chloride: 101 mmol/L (ref 98–110)
Creat: 0.81 mg/dL (ref 0.50–0.99)
GFR, Est African American: 91 mL/min/{1.73_m2} (ref >=60)
GFR, Est Non African American: 78 mL/min/{1.73_m2} (ref >=60)
Globulin: 2.4 g/dL (calc) (ref 1.9–3.7)
Glucose, Bld: 103 mg/dL — ABNORMAL HIGH (ref 65–99)
Potassium: 5.1 mmol/L (ref 3.5–5.3)
Sodium: 139 mmol/L (ref 135–146)
Total Bilirubin: 0.5 mg/dL (ref 0.2–1.2)
Total Protein: 7.1 g/dL (ref 6.1–8.1)

## 2019-11-29 LAB — TSH: TSH: 3.76 mIU/L (ref 0.40–4.50)

## 2019-12-21 ENCOUNTER — Other Ambulatory Visit: Payer: Self-pay

## 2019-12-21 DIAGNOSIS — L680 Hirsutism: Secondary | ICD-10-CM

## 2019-12-21 DIAGNOSIS — E8881 Metabolic syndrome: Secondary | ICD-10-CM

## 2019-12-21 DIAGNOSIS — I10 Essential (primary) hypertension: Secondary | ICD-10-CM

## 2019-12-21 MED ORDER — ATENOLOL 25 MG PO TABS: 25.0000 mg | ORAL_TABLET | Freq: Every day | ORAL | 1 refills | Status: DC

## 2019-12-21 MED ORDER — METFORMIN HCL ER 500 MG PO TB24: ORAL_TABLET | ORAL | 1 refills | Status: DC

## 2019-12-29 ENCOUNTER — Other Ambulatory Visit: Payer: Self-pay

## 2019-12-29 ENCOUNTER — Other Ambulatory Visit: Payer: 59

## 2019-12-29 DIAGNOSIS — E039 Hypothyroidism, unspecified: Secondary | ICD-10-CM

## 2019-12-30 ENCOUNTER — Other Ambulatory Visit: Payer: Self-pay | Admitting: Adult Health Nurse Practitioner

## 2019-12-30 ENCOUNTER — Other Ambulatory Visit: Payer: Self-pay | Admitting: Internal Medicine

## 2019-12-30 ENCOUNTER — Telehealth: Payer: Self-pay

## 2019-12-30 DIAGNOSIS — E039 Hypothyroidism, unspecified: Secondary | ICD-10-CM

## 2019-12-30 LAB — TSH: TSH: 0.14 mIU/L — ABNORMAL LOW (ref 0.40–4.50)

## 2019-12-30 MED ORDER — LEVOTHYROXINE SODIUM 100 MCG PO TABS: ORAL_TABLET | ORAL | 0 refills | Status: DC

## 2019-12-30 MED ORDER — LEVOTHYROXINE SODIUM 100 MCG PO TABS: 100.0000 ug | ORAL_TABLET | Freq: Every day | ORAL | 3 refills | Status: DC

## 2019-12-30 NOTE — Telephone Encounter (Signed)
Patient is requesting a prescription for Synthroid, 100mg . Was given samples and won't have enough for over the weekend. 

## 2020-03-12 ENCOUNTER — Other Ambulatory Visit: Payer: Self-pay | Admitting: Internal Medicine

## 2020-03-12 DIAGNOSIS — L680 Hirsutism: Secondary | ICD-10-CM

## 2020-03-12 DIAGNOSIS — E039 Hypothyroidism, unspecified: Secondary | ICD-10-CM

## 2020-03-12 MED ORDER — LEVOTHYROXINE SODIUM 100 MCG PO TABS: ORAL_TABLET | ORAL | 0 refills | Status: DC

## 2020-03-12 MED ORDER — SPIRONOLACTONE 100 MG PO TABS: 100.0000 mg | ORAL_TABLET | Freq: Every day | ORAL | 0 refills | Status: DC

## 2020-06-06 ENCOUNTER — Other Ambulatory Visit: Payer: Self-pay | Admitting: Internal Medicine

## 2020-06-06 ENCOUNTER — Other Ambulatory Visit: Payer: Self-pay

## 2020-06-06 DIAGNOSIS — I10 Essential (primary) hypertension: Secondary | ICD-10-CM

## 2020-06-06 DIAGNOSIS — L680 Hirsutism: Secondary | ICD-10-CM

## 2020-06-06 MED ORDER — ATENOLOL 25 MG PO TABS: 25.0000 mg | ORAL_TABLET | Freq: Every day | ORAL | 0 refills | Status: DC

## 2020-06-06 NOTE — Telephone Encounter (Signed)
Refill on ATENOLOL  

## 2020-07-04 ENCOUNTER — Other Ambulatory Visit: Payer: Self-pay | Admitting: Internal Medicine

## 2020-07-04 DIAGNOSIS — I10 Essential (primary) hypertension: Secondary | ICD-10-CM

## 2020-07-04 DIAGNOSIS — E039 Hypothyroidism, unspecified: Secondary | ICD-10-CM

## 2020-07-04 MED ORDER — LEVOTHYROXINE SODIUM 100 MCG PO TABS: ORAL_TABLET | ORAL | 1 refills | Status: DC

## 2020-08-28 ENCOUNTER — Other Ambulatory Visit: Payer: Self-pay | Admitting: Internal Medicine

## 2020-08-28 DIAGNOSIS — L680 Hirsutism: Secondary | ICD-10-CM

## 2020-08-28 DIAGNOSIS — E8881 Metabolic syndrome: Secondary | ICD-10-CM

## 2020-08-28 MED ORDER — METFORMIN HCL ER 500 MG PO TB24: ORAL_TABLET | ORAL | 1 refills | Status: DC

## 2020-10-20 ENCOUNTER — Other Ambulatory Visit: Payer: Self-pay | Admitting: Internal Medicine

## 2020-10-20 DIAGNOSIS — I10 Essential (primary) hypertension: Secondary | ICD-10-CM

## 2020-11-05 NOTE — Progress Notes (Signed)
Complete Physical  Assessment and Plan: Crystal Guzman was seen today for annual exam.  Diagnoses and all orders for this visit:  Encounter for general adult medical examination with abnormal findings  Due Annually -     CBC with Differential/Platelet -     COMPLETE METABOLIC PANEL WITH GFR -     Magnesium -     Lipid panel -     TSH -     Hemoglobin A1c -     VITAMIN D 25 Hydroxy (Vit-D Deficiency, Fractures) -     EKG 12-Lead -     Urinalysis, Routine w reflex microscopic -     Microalbumin / creatinine urine ratio -     buPROPion (WELLBUTRIN XL) 150 MG 24 hr tablet; Take 1 tablet (150 mg total) by mouth every morning. -     MM DIAG BREAST TOMO BILATERAL; Future -     Cologuard -     DG Bone Density; Future  Hypertension, benign -     CBC with Differential/Platelet -     EKG 12-Lead  - continue medications, DASH diet, exercise and monitor at home. Call if greater than 130/80.    Mixed hyperlipidemia -     COMPLETE METABOLIC PANEL WITH GFR -     Lipid panel - Continue diet and exercise , has lost 18 pounds in 1 year  Hypothyroidism (acquired) -     TSH - Continue current levothyroxine dosage and will adjust pending result . Reminded to take medication first thing in the morning and wait 30 minutes before taking food, drink and other medications  Anemia, unspecified type   Check CBC  Continue ferrous sulfate 325mg  daily  Insomnia, unspecified type  Continue Xanax as needed  Discussed good sleep hygiene, difficult with taking care of 30 year old father with dementia  Fibromyalgia  Flexeril as neeeded, continue diet and exercise  Abnormal glucose -     Hemoglobin A1c - Continue diet and exercise  Screening for blood or protein in urine -     Urinalysis, Routine w reflex microscopic -     Microalbumin / creatinine urine ratio  Screening for cardiovascular condition -     EKG 12-Lead  Medication management -     CBC with Differential/Platelet -     COMPLETE METABOLIC  PANEL WITH GFR -     Magnesium -     Lipid panel -     TSH -     Hemoglobin A1c -     VITAMIN D 25 Hydroxy (Vit-D Deficiency, Fractures) -     EKG 12-Lead -     Urinalysis, Routine w reflex microscopic -     Microalbumin / creatinine urine ratio  Anxiety state -     buPROPion (WELLBUTRIN XL) 150 MG 24 hr tablet; Take 1 tablet (150 mg total) by mouth every morning.  Screening mammogram for breast cancer -     MM DIAG BREAST TOMO BILATERAL; Future  Screening for colon cancer -     Cologuard  Screening for osteoporosis -     DG Bone Density; Future  Hirsuitism  Check A1C and testosterone  Pt has noted no difference with use of Metformin and spironolactone, will check testosterone and A1C today and treat pending results   Discussed med's effects and SE's. Screening labs and tests as requested with regular follow-up as recommended. Over 40 minutes of exam, counseling, chart review, and complex, high level critical decision making was performed this visit.  HPI  60 y.o. female  presents for a complete physical and follow up for has Anxiety state; Fibromyalgia; Hereditary and idiopathic peripheral neuropathy; Hirsutism; Hypertension, benign; Hypothyroidism (acquired); Idiopathic urticaria; Insomnia; Low back pain; Mixed hyperlipidemia; Obesity; and Vitamin D deficiency on their problem list..  Her blood pressure has been controlled at home, today their BP is BP: 132/80 BP Readings from Last 3 Encounters:  11/06/20 132/80  11/07/19 140/82    She does workout. She denies chest pain, shortness of breath, dizziness.  She is walking 6-10 miles a day BMI is Body mass index is 26.16 kg/m., she has been working on diet and exercise. Wt Readings from Last 3 Encounters:  11/06/20 154 lb 12.8 oz (70.2 kg)  11/07/19 172 lb 9.6 oz (78.3 kg)   She is taking care of her 31 year old father, having increased anxiety as she is only caregiver and gets very little relief.  Feels overwhelmed , no  suicidal or homicidal ideation.   She is not on cholesterol medication and denies myalgias. Her cholesterol is not at goal. The cholesterol last visit was:   Lab Results  Component Value Date   CHOL 259 (H) 11/07/2019   HDL 50 11/07/2019   LDLCALC 166 (H) 11/07/2019   TRIG 262 (H) 11/07/2019   CHOLHDL 5.2 (H) 11/07/2019    She has been working on diet and exercise for prediabetes, she is on bASA, she is not on ACE/ARB and denies hyperglycemia. Last A1C in the office was:  Lab Results  Component Value Date   HGBA1C 5.8 (H) 11/07/2019   Pt has been taking spironolactone and Metformin for hirsuitism but does not notice any difference with use of medication. Will check testosterone and A1C today.   Last GFR: Lab Results  Component Value Date   GFRNONAA 78 11/28/2019    Patient is on Vitamin D supplement.   Lab Results  Component Value Date   VD25OH 78 11/07/2019      Current Medications:  Current Outpatient Medications on File Prior to Visit  Medication Sig Dispense Refill   ALPRAZolam (XANAX) 1 MG tablet Take by mouth at bedtime as needed.      Ascorbic Acid (VITAMIN C) 1000 MG tablet Take 1,000 mg by mouth every other day.     aspirin 81 MG chewable tablet Chew 1 tablet by mouth daily.     atenolol (TENORMIN) 25 MG tablet Take 1 tablet  Daily for BP 90 tablet 0   Calcium Carb-Cholecalciferol (CALCIUM 600-D PO) Take 1 capsule by mouth daily.     Cholecalciferol 125 MCG (5000 UT) TABS Take 2,000 Units by mouth.      Cyanocobalamin (B-12 SL) Place under the tongue.     cyclobenzaprine (FLEXERIL) 10 MG tablet Take by mouth as needed.      Docusate Calcium (STOOL SOFTENER PO) Take by mouth.     ferrous sulfate 325 (65 FE) MG tablet Take 1 tablet by mouth every other day.     levothyroxine (SYNTHROID) 100 MCG tablet Take  1 tablet  Daily  on an empty stomach with only water for 30 minutes & no Antacid meds, Calcium or Magnesium for 4 hours & avoid Biotin 90 tablet 1   metFORMIN  (GLUCOPHAGE-XR) 500 MG 24 hr tablet Take  1 tablet  Daily  for Hirsutism 90 tablet 1   Multiple Vitamin (MULTIVITAMIN) tablet Take 1 tablet by mouth daily.     spironolactone (ALDACTONE) 100 MG tablet Take 1 tablet by mouth  once daily 90 tablet 1   No current facility-administered medications on file prior to visit.   Allergies:  Not on File Medical History:  She has Anxiety state; Fibromyalgia; Hereditary and idiopathic peripheral neuropathy; Hirsutism; Hypertension, benign; Hypothyroidism (acquired); Idiopathic urticaria; Insomnia; Low back pain; Mixed hyperlipidemia; Obesity; and Vitamin D deficiency on their problem list. Health Maintenance:   Immunization History  Administered Date(s) Administered   Influenza-Unspecified 11/29/2018   Tdap 03/30/2018, 11/07/2019   Zoster Recombinat (Shingrix) 11/12/2016, 01/21/2017    Tetanus:11/07/19 Pneumovax:N/A Prevnar 13:  Flu vaccine:  Shingrix: 11/12/16, 01/21/17 LMP: Pap: 20 years ago, Had TAH  MGM: 12/2018 negative repeat 1 year, will schedule DEXA: schedule Colonoscopy: 2016 due 2021 EGD:  Last Dental Exam:03/2020 Last Eye Exam: Dr. Carlynn PurlPerez 03/2019 Patient Care Team: Lucky CowboyMcKeown, William, MD as PCP - General (Internal Medicine)  Surgical History:  She has a past surgical history that includes Abdominal hysterectomy (1994); Total thyroidectomy (2001); Tonsilectomy/adenoidectomy with myringotomy (1967); and Carpal tunnel with cubital tunnel (Bilateral, 2002). Family History:  Herfamily history includes Angina in her mother; Arthritis in her father and mother; Colitis in her mother; Colon cancer in her mother; Gout in her father; Heart attack in her father; Heart disease in her father; Hypertension in her father and mother; Lung cancer in her mother; Migraines in her father; Stroke in her father; Tuberculosis in her sister. Social History:  She reports that she quit smoking about 21 years ago. She has never used smokeless tobacco. She reports  that she does not drink alcohol and does not use drugs.  Review of Systems: Review of Systems  Constitutional:  Negative for chills and fever.  HENT:  Negative for congestion, hearing loss, sinus pain, sore throat and tinnitus.   Eyes:  Negative for blurred vision and double vision.  Respiratory:  Negative for cough, hemoptysis, sputum production, shortness of breath and wheezing.   Cardiovascular:  Negative for chest pain, palpitations and leg swelling.  Gastrointestinal:  Negative for abdominal pain, constipation, diarrhea, heartburn, nausea and vomiting.  Genitourinary:  Negative for dysuria and urgency.  Musculoskeletal:  Positive for joint pain (shoulder pain right side). Negative for back pain, falls, myalgias and neck pain.  Skin:  Negative for rash.       Great toe on each foot is blackened since 10 mile walk  Neurological:  Negative for dizziness, tingling, tremors, weakness and headaches.       Some sensation loss of toes  Endo/Heme/Allergies:  Does not bruise/bleed easily.  Psychiatric/Behavioral:  Negative for depression and suicidal ideas. The patient is not nervous/anxious and does not have insomnia.    Physical Exam: Estimated body mass index is 26.16 kg/m as calculated from the following:   Height as of this encounter: 5' 4.5" (1.638 m).   Weight as of this encounter: 154 lb 12.8 oz (70.2 kg). BP 132/80   Pulse 70   Temp 97.7 F (36.5 C)   Ht 5' 4.5" (1.638 m)   Wt 154 lb 12.8 oz (70.2 kg)   SpO2 95%   BMI 26.16 kg/m  General Appearance: Well nourished, in no apparent distress.  Eyes: PERRLA, EOMs, conjunctiva no swelling or erythema, normal fundi and vessels.  Sinuses: No Frontal/maxillary tenderness  ENT/Mouth: Ext aud canals clear, normal light reflex with TMs without erythema, bulging. Good dentition. No erythema, swelling, or exudate on post pharynx. Tonsils not swollen or erythematous. Hearing normal.  Neck: Supple, thyroid normal. No bruits  Respiratory:  Respiratory effort normal, BS equal bilaterally without  rales, rhonchi, wheezing or stridor.  Cardio: RRR without murmurs, rubs or gallops. Brisk peripheral pulses without edema.  Chest: symmetric, with normal excursions and percussion.  Breasts: Symmetric, without lumps, nipple discharge, retractions.  Abdomen: Positive bowel sounds all 4 quadrants, Soft, nontender, no guarding, rebound, hernias, masses, or organomegaly.  Lymphatics: Non tender without lymphadenopathy.  Genitourinary:Deferred Musculoskeletal: Full ROM all peripheral extremities,5/5 strength, and normal gait.  Skin: Warm, dry without rashes, lesions, ecchymosis. Great toenail bilaterally blackened, loose Neuro: Cranial nerves intact, reflexes equal bilaterally. Normal muscle tone, no cerebellar symptoms. Decreased sensation of toes, normal from foot up Psych: Awake and oriented X 3, normal affect, Insight and Judgment appropriate.   EKG: WNL no ST changes.   Raschelle Wisenbaker W Debroah Shuttleworth 1:58 PM [facility]

## 2020-11-06 ENCOUNTER — Encounter: Payer: Self-pay | Admitting: Adult Health Nurse Practitioner

## 2020-11-06 ENCOUNTER — Other Ambulatory Visit: Payer: Self-pay

## 2020-11-06 ENCOUNTER — Ambulatory Visit (INDEPENDENT_AMBULATORY_CARE_PROVIDER_SITE_OTHER): Payer: 59 | Admitting: Nurse Practitioner

## 2020-11-06 ENCOUNTER — Encounter: Payer: Self-pay | Admitting: Nurse Practitioner

## 2020-11-06 VITALS — BP 132/80 | HR 70 | Temp 97.7°F | Ht 64.5 in | Wt 154.8 lb

## 2020-11-06 DIAGNOSIS — F411 Generalized anxiety disorder: Secondary | ICD-10-CM

## 2020-11-06 DIAGNOSIS — Z13220 Encounter for screening for lipoid disorders: Secondary | ICD-10-CM | POA: Diagnosis not present

## 2020-11-06 DIAGNOSIS — I10 Essential (primary) hypertension: Secondary | ICD-10-CM | POA: Diagnosis not present

## 2020-11-06 DIAGNOSIS — Z131 Encounter for screening for diabetes mellitus: Secondary | ICD-10-CM | POA: Diagnosis not present

## 2020-11-06 DIAGNOSIS — E559 Vitamin D deficiency, unspecified: Secondary | ICD-10-CM

## 2020-11-06 DIAGNOSIS — L680 Hirsutism: Secondary | ICD-10-CM

## 2020-11-06 DIAGNOSIS — Z79899 Other long term (current) drug therapy: Secondary | ICD-10-CM

## 2020-11-06 DIAGNOSIS — D649 Anemia, unspecified: Secondary | ICD-10-CM

## 2020-11-06 DIAGNOSIS — Z136 Encounter for screening for cardiovascular disorders: Secondary | ICD-10-CM | POA: Diagnosis not present

## 2020-11-06 DIAGNOSIS — Z1389 Encounter for screening for other disorder: Secondary | ICD-10-CM

## 2020-11-06 DIAGNOSIS — Z1231 Encounter for screening mammogram for malignant neoplasm of breast: Secondary | ICD-10-CM

## 2020-11-06 DIAGNOSIS — Z13820 Encounter for screening for osteoporosis: Secondary | ICD-10-CM

## 2020-11-06 DIAGNOSIS — E039 Hypothyroidism, unspecified: Secondary | ICD-10-CM

## 2020-11-06 DIAGNOSIS — E782 Mixed hyperlipidemia: Secondary | ICD-10-CM

## 2020-11-06 DIAGNOSIS — M797 Fibromyalgia: Secondary | ICD-10-CM

## 2020-11-06 DIAGNOSIS — Z0000 Encounter for general adult medical examination without abnormal findings: Secondary | ICD-10-CM

## 2020-11-06 DIAGNOSIS — Z0001 Encounter for general adult medical examination with abnormal findings: Secondary | ICD-10-CM

## 2020-11-06 DIAGNOSIS — G4700 Insomnia, unspecified: Secondary | ICD-10-CM

## 2020-11-06 DIAGNOSIS — Z1211 Encounter for screening for malignant neoplasm of colon: Secondary | ICD-10-CM

## 2020-11-06 DIAGNOSIS — R7309 Other abnormal glucose: Secondary | ICD-10-CM

## 2020-11-06 MED ORDER — BUPROPION HCL ER (XL) 150 MG PO TB24: 150.0000 mg | ORAL_TABLET | ORAL | 2 refills | Status: DC

## 2020-11-06 NOTE — Patient Instructions (Signed)
Bupropion Tablets (Depression/Mood Disorders) What is this medication? BUPROPION (byoo PROE pee on) treats depression. It increases norepinephrine and dopamine in the brain, hormones that help regulate mood. It belongs to a groupof medications called NDRIs. This medicine may be used for other purposes; ask your health care provider orpharmacist if you have questions. COMMON BRAND NAME(S): Wellbutrin What should I tell my care team before I take this medication? They need to know if you have any of these conditions: An eating disorder, such as anorexia or bulimia Bipolar disorder or psychosis Diabetes or high blood sugar, treated with medication Glaucoma Heart disease, previous heart attack, or irregular heart beat Head injury or brain tumor High blood pressure Kidney or liver disease Seizures Suicidal thoughts or a previous suicide attempt Tourette's syndrome Weight loss An unusual or allergic reaction to bupropion, other medications, foods, dyes, or preservatives Pregnant or trying to become pregnant Breast-feeding How should I use this medication? Take this medication by mouth with a glass of water. Follow the directions on the prescription label. You can take it with or without food. If it upsets your stomach, take it with food. Take your medication at regular intervals. Do not take your medication more often than directed. Do not stop taking this medication suddenly except upon the advice of your care team. Stopping this medication too quickly may cause serious side effects or your condition mayworsen. A special MedGuide will be given to you by the pharmacist with eachprescription and refill. Be sure to read this information carefully each time. Talk to your care team regarding the use of this medication in children.Special care may be needed. Overdosage: If you think you have taken too much of this medicine contact apoison control center or emergency room at once. NOTE: This medicine  is only for you. Do not share this medicine with others. What if I miss a dose? If you miss a dose, take it as soon as you can. If it is less than four hours to your next dose, take only that dose and skip the missed dose. Do not takedouble or extra doses. What may interact with this medication? Do not take this medication with any of the following: Linezolid MAOIs like Azilect, Carbex, Eldepryl, Marplan, Nardil, and Parnate Methylene blue (injected into a vein) Other medications that contain bupropion like Zyban This medication may also interact with the following: Alcohol Certain medications for anxiety or sleep Certain medications for blood pressure like metoprolol, propranolol Certain medications for depression or psychotic disturbances Certain medications for HIV or AIDS like efavirenz, lopinavir, nelfinavir, ritonavir Certain medications for irregular heart beat like propafenone, flecainide Certain medications for Parkinson's disease like amantadine, levodopa Certain medications for seizures like carbamazepine, phenytoin, phenobarbital Cimetidine Clopidogrel Cyclophosphamide Digoxin Furazolidone Isoniazid Nicotine Orphenadrine Procarbazine Steroid medications like prednisone or cortisone Stimulant medications for attention disorders, weight loss, or to stay awake Tamoxifen Theophylline Thiotepa Ticlopidine Tramadol Warfarin This list may not describe all possible interactions. Give your health care provider a list of all the medicines, herbs, non-prescription drugs, or dietary supplements you use. Also tell them if you smoke, drink alcohol, or use illegaldrugs. Some items may interact with your medicine. What should I watch for while using this medication? Tell your care team if your symptoms do not get better or if they get worse. Visit your care team for regular checks on your progress. Because it may take several weeks to see the full effects of this medication, it is  important tocontinue your treatment as prescribed.   Watch for new or worsening thoughts of suicide or depression. This includes sudden changes in mood, behavior, or thoughts. These changes can happen at any time but are more common in the beginning of treatment or after a change in dose. Call your care team right away if you experience these thoughts orworsening depression. Manic episodes may happen in patients with bipolar disorder who take this medication. Watch for changes in feelings or behaviors such as feeling anxious, nervous, agitated, panicky, irritable, hostile, aggressive, impulsive, severely restless, overly excited and hyperactive, or trouble sleeping. These symptoms can happen at anytime but are more common in the beginning of treatment or after a change in dose. Call your care team right away if you notice any ofthese symptoms. This medication may cause serious skin reactions. They can happen weeks to months after starting the medication. Contact your care team right away if you notice fevers or flu-like symptoms with a rash. The rash may be red or purple and then turn into blisters or peeling of the skin. Or, you might notice a red rash with swelling of the face, lips or lymph nodes in your neck or under yourarms. Avoid drinks that contain alcohol while taking this medication. Drinking large amounts of alcohol, using sleeping or anxiety medications, or quickly stopping the use of these agents while taking this medication may increase your risk fora seizure. Do not drive or use heavy machinery until you know how this medication affectsyou. This medication can impair your ability to perform these tasks. Do not take this medication close to bedtime. It may prevent you from sleeping. Your mouth may get dry. Chewing sugarless gum or sucking hard candy, and drinking plenty of water may help. Contact your care team if the problem doesnot go away or is severe. What side effects may I notice from  receiving this medication? Side effects that you should report to your care team as soon as possible: Allergic reactions-skin rash, itching, hives, swelling of the face, lips, tongue, or throat Increase in blood pressure Mood and behavior changes-anxiety, nervousness, confusion, hallucinations, irritability, hostility, thoughts of suicide or self-harm, worsening mood, feelings of depression Redness, blistering, peeling, or loosening of the skin, including inside the mouth Seizures Sudden eye pain or change in vision such as blurry vision, seeing halos around lights, vision loss Side effects that usually do not require medical attention (report to your careteam if they continue or are bothersome): Constipation Dizziness Dry mouth Loss of appetite Nausea Tremors or shaking Trouble sleeping This list may not describe all possible side effects. Call your doctor for medical advice about side effects. You may report side effects to FDA at1-800-FDA-1088. Where should I keep my medication? Keep out of the reach of children and pets. Store at room temperature between 20 and 25 degrees C (68 and 77 degrees F), away from direct sunlight and moisture. Keep tightly closed. Throw away anyunused medication after the expiration date. NOTE: This sheet is a summary. It may not cover all possible information. If you have questions about this medicine, talk to your doctor, pharmacist, orhealth care provider.  2022 Elsevier/Gold Standard (2020-05-15 14:26:11)  

## 2020-11-07 ENCOUNTER — Other Ambulatory Visit: Payer: Self-pay | Admitting: Nurse Practitioner

## 2020-11-07 DIAGNOSIS — D75839 Thrombocytosis, unspecified: Secondary | ICD-10-CM

## 2020-11-07 DIAGNOSIS — E039 Hypothyroidism, unspecified: Secondary | ICD-10-CM

## 2020-11-07 MED ORDER — LEVOTHYROXINE SODIUM 88 MCG PO TABS: 88.0000 ug | ORAL_TABLET | Freq: Every day | ORAL | 11 refills | Status: DC

## 2020-11-08 LAB — TEST AUTHORIZATION

## 2020-11-08 LAB — LIPID PANEL
Cholesterol: 260 mg/dL — ABNORMAL HIGH (ref <200)
HDL: 50 mg/dL (ref >=50)
LDL Cholesterol (Calc): 176 mg/dL (calc) — ABNORMAL HIGH
Non-HDL Cholesterol (Calc): 210 mg/dL (calc) — ABNORMAL HIGH (ref <130)
Total CHOL/HDL Ratio: 5.2 (calc) — ABNORMAL HIGH (ref <5.0)
Triglycerides: 188 mg/dL — ABNORMAL HIGH (ref <150)

## 2020-11-08 LAB — COMPLETE METABOLIC PANEL WITH GFR
AG Ratio: 1.8 (calc) (ref 1.0–2.5)
ALT: 10 U/L (ref 6–29)
AST: 18 U/L (ref 10–35)
Albumin: 4.6 g/dL (ref 3.6–5.1)
Alkaline phosphatase (APISO): 73 U/L (ref 37–153)
BUN: 12 mg/dL (ref 7–25)
CO2: 31 mmol/L (ref 20–32)
Calcium: 10 mg/dL (ref 8.6–10.4)
Chloride: 99 mmol/L (ref 98–110)
Creat: 0.75 mg/dL (ref 0.50–1.05)
Globulin: 2.6 g/dL (calc) (ref 1.9–3.7)
Glucose, Bld: 94 mg/dL (ref 65–99)
Potassium: 4.5 mmol/L (ref 3.5–5.3)
Sodium: 137 mmol/L (ref 135–146)
Total Bilirubin: 0.4 mg/dL (ref 0.2–1.2)
Total Protein: 7.2 g/dL (ref 6.1–8.1)
eGFR: 90 mL/min/{1.73_m2} (ref >=60)

## 2020-11-08 LAB — URINALYSIS, ROUTINE W REFLEX MICROSCOPIC
Bilirubin Urine: NEGATIVE
Glucose, UA: NEGATIVE
Hgb urine dipstick: NEGATIVE
Leukocytes,Ua: NEGATIVE
Nitrite: NEGATIVE
Protein, ur: NEGATIVE
Specific Gravity, Urine: 1.025 (ref 1.001–1.035)
pH: <=5 (ref 5.0–8.0)

## 2020-11-08 LAB — HEMOGLOBIN A1C
Hgb A1c MFr Bld: 5.6 % of total Hgb (ref <5.7)
Mean Plasma Glucose: 114 mg/dL
eAG (mmol/L): 6.3 mmol/L

## 2020-11-08 LAB — CBC WITH DIFFERENTIAL/PLATELET
Absolute Monocytes: 778 cells/uL (ref 200–950)
Basophils Absolute: 43 cells/uL (ref 0–200)
Basophils Relative: 0.6 %
Eosinophils Absolute: 130 cells/uL (ref 15–500)
Eosinophils Relative: 1.8 %
HCT: 41.5 % (ref 35.0–45.0)
Hemoglobin: 13 g/dL (ref 11.7–15.5)
Lymphs Abs: 2254 cells/uL (ref 850–3900)
MCH: 27.3 pg (ref 27.0–33.0)
MCHC: 31.3 g/dL — ABNORMAL LOW (ref 32.0–36.0)
MCV: 87.2 fL (ref 80.0–100.0)
MPV: 9.9 fL (ref 7.5–12.5)
Monocytes Relative: 10.8 %
Neutro Abs: 3996 cells/uL (ref 1500–7800)
Neutrophils Relative %: 55.5 %
Platelets: 504 10*3/uL — ABNORMAL HIGH (ref 140–400)
RBC: 4.76 10*6/uL (ref 3.80–5.10)
RDW: 12 % (ref 11.0–15.0)
Total Lymphocyte: 31.3 %
WBC: 7.2 10*3/uL (ref 3.8–10.8)

## 2020-11-08 LAB — VITAMIN D 25 HYDROXY (VIT D DEFICIENCY, FRACTURES): Vit D, 25-Hydroxy: 85 ng/mL (ref 30–100)

## 2020-11-08 LAB — MICROALBUMIN / CREATININE URINE RATIO
Creatinine, Urine: 199 mg/dL (ref 20–275)
Microalb Creat Ratio: 5 mcg/mg creat (ref <30)
Microalb, Ur: 0.9 mg/dL

## 2020-11-08 LAB — TSH: TSH: 0.11 mIU/L — ABNORMAL LOW (ref 0.40–4.50)

## 2020-11-08 LAB — PATHOLOGIST SMEAR REVIEW

## 2020-11-08 LAB — TESTOSTERONE, TOTAL, LC/MS/MS: Testosterone, Total, LC-MS-MS: 52 ng/dL — ABNORMAL HIGH (ref 2–45)

## 2020-11-08 LAB — MAGNESIUM: Magnesium: 1.9 mg/dL (ref 1.5–2.5)

## 2020-11-12 ENCOUNTER — Other Ambulatory Visit: Payer: Self-pay | Admitting: Nurse Practitioner

## 2020-11-12 DIAGNOSIS — L680 Hirsutism: Secondary | ICD-10-CM

## 2020-12-01 ENCOUNTER — Other Ambulatory Visit: Payer: Self-pay | Admitting: Adult Health

## 2020-12-01 DIAGNOSIS — L680 Hirsutism: Secondary | ICD-10-CM

## 2020-12-02 ENCOUNTER — Other Ambulatory Visit: Payer: Self-pay | Admitting: Internal Medicine

## 2020-12-02 DIAGNOSIS — F411 Generalized anxiety disorder: Secondary | ICD-10-CM

## 2020-12-02 DIAGNOSIS — Z0001 Encounter for general adult medical examination with abnormal findings: Secondary | ICD-10-CM

## 2020-12-02 MED ORDER — BUPROPION HCL ER (XL) 150 MG PO TB24: ORAL_TABLET | ORAL | 1 refills | Status: DC

## 2021-02-04 NOTE — Progress Notes (Deleted)
FOLLOW UP  Assessment and Plan:   Hypertension Well controlled with current medications  Monitor blood pressure at home; patient to call if consistently greater than 130/80 Continue DASH diet.   Reminder to go to the ER if any CP, SOB, nausea, dizziness, severe HA, changes vision/speech, left arm numbness and tingling and jaw pain.  Cholesterol Currently at goal;  Continue low cholesterol diet and exercise.  Check lipid panel.   Diabetes {with or without complications:30421263} Continue medication: Continue diet and exercise.  Perform daily foot/skin check, notify office of any concerning changes.  Check A1C  Obesity with co morbidities Long discussion about weight loss, diet, and exercise Recommended diet heavy in fruits and veggies and low in animal meats, cheeses, and dairy products, appropriate calorie intake Discussed ideal weight for height (below ***) and initial weight goal (***) Patient will work on *** Will follow up in 3 months  Vitamin D Def At goal at last visit; continue supplementation to maintain goal of 60-100 Defer Vit D level  Continue diet and meds as discussed. Further disposition pending results of labs. Discussed med's effects and SE's.   Over 30 minutes of exam, counseling, chart review, and critical decision making was performed.   Future Appointments  Date Time Provider Department Center  02/06/2021  4:00 PM Revonda Humphrey, NP [facility] None  11/06/2021  2:00 PM Revonda Humphrey, NP [facility] None    ----------------------------------------------------------------------------------------------------------------------  HPI 60 y.o. female  presents for 3 month follow up on hypertension, cholesterol, diabetes, weight and vitamin D deficiency.   BMI is There is no height or weight on file to calculate BMI., she has been working on diet and exercise. Wt Readings from Last 3 Encounters:  11/06/20 154 lb 12.8 oz (70.2 kg)  11/07/19 172 lb 9.6 oz (78.3  kg)    Her blood pressure has been controlled at home, today their BP is   BP Readings from Last 3 Encounters:  11/06/20 132/80  11/07/19 140/82     She does workout. She denies chest pain, shortness of breath, dizziness.   She is not on cholesterol medication  Her cholesterol is not at goal. The cholesterol last visit was:   Lab Results  Component Value Date   CHOL 260 (H) 11/06/2020   HDL 50 11/06/2020   LDLCALC 176 (H) 11/06/2020   TRIG 188 (H) 11/06/2020   CHOLHDL 5.2 (H) 11/06/2020    She has been working on diet and exercise for prediabetes, and denies {Symptoms; diabetes w/o none:19199}. Last A1C in the office was:  Lab Results  Component Value Date   HGBA1C 5.6 11/06/2020   Patient is on Vitamin D supplement.   Lab Results  Component Value Date   VD25OH 77 11/06/2020        Current Medications:  Current Outpatient Medications on File Prior to Visit  Medication Sig   Ascorbic Acid (VITAMIN C) 1000 MG tablet Take 1,000 mg by mouth every other day.   aspirin 81 MG chewable tablet Chew 1 tablet by mouth daily.   atenolol (TENORMIN) 25 MG tablet Take 1 tablet  Daily for BP   buPROPion (WELLBUTRIN XL) 150 MG 24 hr tablet Take  1 tablet  Daily  for Mood, Focus & Concentration   Calcium Carb-Cholecalciferol (CALCIUM 600-D PO) Take 1 capsule by mouth daily.   Cholecalciferol 125 MCG (5000 UT) TABS Take 2,000 Units by mouth.    Docusate Calcium (STOOL SOFTENER PO) Take by mouth.   ferrous sulfate 325 (  65 FE) MG tablet Take 1 tablet by mouth every other day.   Garlic 1200 MG CAPS Take by mouth.   levothyroxine (SYNTHROID) 88 MCG tablet Take 1 tablet (88 mcg total) by mouth daily.   metFORMIN (GLUCOPHAGE-XR) 500 MG 24 hr tablet Take  1 tablet  Daily  for Hirsutism   Multiple Vitamin (MULTIVITAMIN) tablet Take 1 tablet by mouth daily.   Red Yeast Rice 600 MG CAPS Take by mouth.   spironolactone (ALDACTONE) 100 MG tablet Take  1 tablet  Daily  for hirsuitism   No current  facility-administered medications on file prior to visit.     Allergies: Not on File   Medical History: No past medical history on file. Family history- Reviewed and unchanged Social history- Reviewed and unchanged   Review of Systems:  ROS    Physical Exam: There were no vitals taken for this visit. Wt Readings from Last 3 Encounters:  11/06/20 154 lb 12.8 oz (70.2 kg)  11/07/19 172 lb 9.6 oz (78.3 kg)   General Appearance: Well nourished, in no apparent distress. Eyes: PERRLA, EOMs, conjunctiva no swelling or erythema Sinuses: No Frontal/maxillary tenderness ENT/Mouth: Ext aud canals clear, TMs without erythema, bulging. No erythema, swelling, or exudate on post pharynx.  Tonsils not swollen or erythematous. Hearing normal.  Neck: Supple, thyroid normal.  Respiratory: Respiratory effort normal, BS equal bilaterally without rales, rhonchi, wheezing or stridor.  Cardio: RRR with no MRGs. Brisk peripheral pulses without edema.  Abdomen: Soft, + BS.  Non tender, no guarding, rebound, hernias, masses. Lymphatics: Non tender without lymphadenopathy.  Musculoskeletal: Full ROM, 5/5 strength, {PSY - GAIT AND STATION:22860} gait Skin: Warm, dry without rashes, lesions, ecchymosis.  Neuro: Cranial nerves intact. No cerebellar symptoms.  Psych: Awake and oriented X 3, normal affect, Insight and Judgment appropriate.    Revonda Humphrey, NP 1:22 PM  [facility]

## 2021-02-06 ENCOUNTER — Ambulatory Visit: Payer: 59 | Admitting: Nurse Practitioner

## 2021-02-06 ENCOUNTER — Other Ambulatory Visit: Payer: 59

## 2021-02-06 DIAGNOSIS — I10 Essential (primary) hypertension: Secondary | ICD-10-CM

## 2021-02-06 DIAGNOSIS — E782 Mixed hyperlipidemia: Secondary | ICD-10-CM

## 2021-02-06 DIAGNOSIS — R7309 Other abnormal glucose: Secondary | ICD-10-CM

## 2021-02-06 DIAGNOSIS — F411 Generalized anxiety disorder: Secondary | ICD-10-CM

## 2021-02-06 DIAGNOSIS — E559 Vitamin D deficiency, unspecified: Secondary | ICD-10-CM

## 2021-02-06 DIAGNOSIS — Z79899 Other long term (current) drug therapy: Secondary | ICD-10-CM

## 2021-02-06 DIAGNOSIS — E039 Hypothyroidism, unspecified: Secondary | ICD-10-CM

## 2021-02-09 ENCOUNTER — Other Ambulatory Visit: Payer: Self-pay | Admitting: Internal Medicine

## 2021-02-09 DIAGNOSIS — L680 Hirsutism: Secondary | ICD-10-CM

## 2021-02-09 DIAGNOSIS — E8881 Metabolic syndrome: Secondary | ICD-10-CM

## 2021-02-09 DIAGNOSIS — I10 Essential (primary) hypertension: Secondary | ICD-10-CM

## 2021-02-11 NOTE — Progress Notes (Deleted)
FOLLOW UP  Assessment and Plan:   Hypertension Well controlled with current medications  Monitor blood pressure at home; patient to call if consistently greater than 130/80 Continue DASH diet.   Reminder to go to the ER if any CP, SOB, nausea, dizziness, severe HA, changes vision/speech, left arm numbness and tingling and jaw pain.  Cholesterol Currently at goal;  Continue low cholesterol diet and exercise.  Check lipid panel.   Abnormal glucose Continue medication: Continue diet and exercise.  Perform daily foot/skin check, notify office of any concerning changes.  Check A1C  Overweight Long discussion about weight loss, diet, and exercise Recommended diet heavy in fruits and veggies and low in animal meats, cheeses, and dairy products, appropriate calorie intake   Vitamin D Def At goal at last visit; continue supplementation to maintain goal of 60-100 Defer Vit D level  Continue diet and meds as discussed. Further disposition pending results of labs. Discussed med's effects and SE's.   Over 30 minutes of exam, counseling, chart review, and critical decision making was performed.   Future Appointments  Date Time Provider Department Center  02/12/2021  4:00 PM Revonda Humphrey, NP [facility] None  11/06/2021  2:00 PM Revonda Humphrey, NP [facility] None    ----------------------------------------------------------------------------------------------------------------------  HPI 60 y.o. female  presents for 3 month follow up on hypertension, cholesterol, diabetes, weight and vitamin D deficiency.   BMI is There is no height or weight on file to calculate BMI., she has been working on diet and exercise. Wt Readings from Last 3 Encounters:  11/06/20 154 lb 12.8 oz (70.2 kg)  11/07/19 172 lb 9.6 oz (78.3 kg)    Her blood pressure has been controlled at home, today their BP is   BP Readings from Last 3 Encounters:  11/06/20 132/80  11/07/19 140/82     She does workout. She  denies chest pain, shortness of breath, dizziness.   She is not on cholesterol medication  Her cholesterol is not at goal. The cholesterol last visit was:   Lab Results  Component Value Date   CHOL 260 (H) 11/06/2020   HDL 50 11/06/2020   LDLCALC 176 (H) 11/06/2020   TRIG 188 (H) 11/06/2020   CHOLHDL 5.2 (H) 11/06/2020    She has been working on diet and exercise for prediabetes, and denies {Symptoms; diabetes w/o none:19199}. Last A1C in the office was:  Lab Results  Component Value Date   HGBA1C 5.6 11/06/2020   Patient is on Vitamin D supplement.   Lab Results  Component Value Date   VD25OH 72 11/06/2020        Current Medications:  Current Outpatient Medications on File Prior to Visit  Medication Sig   Ascorbic Acid (VITAMIN C) 1000 MG tablet Take 1,000 mg by mouth every other day.   aspirin 81 MG chewable tablet Chew 1 tablet by mouth daily.   atenolol (TENORMIN) 25 MG tablet Take  1 tablet  Daily  for BP                                          /                         Take 1 tablet by mouth once daily   buPROPion (WELLBUTRIN XL) 150 MG 24 hr tablet Take  1 tablet  Daily  for Mood, Focus & Concentration   Calcium Carb-Cholecalciferol (CALCIUM 600-D PO) Take 1 capsule by mouth daily.   Cholecalciferol 125 MCG (5000 UT) TABS Take 2,000 Units by mouth.    Docusate Calcium (STOOL SOFTENER PO) Take by mouth.   ferrous sulfate 325 (65 FE) MG tablet Take 1 tablet by mouth every other day.   Garlic 1200 MG CAPS Take by mouth.   levothyroxine (SYNTHROID) 88 MCG tablet Take 1 tablet (88 mcg total) by mouth daily.   metFORMIN (GLUCOPHAGE-XR) 500 MG 24 hr tablet Take  1 tablet Daily  for Hirsutism                                        /                         TAKE 1 TABLET BY MOUTH ONCE DAILY   Multiple Vitamin (MULTIVITAMIN) tablet Take 1 tablet by mouth daily.   Red Yeast Rice 600 MG CAPS Take by mouth.   spironolactone (ALDACTONE) 100 MG tablet Take  1 tablet  Daily for  Hirsutism                                        /                    TAKE 1 TABLET BY MOUTH ONCE DAILY   No current facility-administered medications on file prior to visit.     Allergies: Not on File   Medical History: No past medical history on file. Family history- Reviewed and unchanged Social history- Reviewed and unchanged   Review of Systems:  Review of Systems  Constitutional:  Negative for chills, fever and weight loss.  HENT:  Negative for congestion and hearing loss.   Eyes:  Negative for blurred vision and double vision.  Respiratory:  Negative for cough and shortness of breath.   Cardiovascular:  Negative for chest pain, palpitations, orthopnea and leg swelling.  Gastrointestinal:  Negative for abdominal pain, constipation, diarrhea, heartburn, nausea and vomiting.  Musculoskeletal:  Negative for falls, joint pain and myalgias.  Skin:  Negative for rash.  Neurological:  Negative for dizziness, tingling, tremors, loss of consciousness and headaches.  Psychiatric/Behavioral:  Negative for depression, memory loss and suicidal ideas.      Physical Exam: There were no vitals taken for this visit. Wt Readings from Last 3 Encounters:  11/06/20 154 lb 12.8 oz (70.2 kg)  11/07/19 172 lb 9.6 oz (78.3 kg)   General Appearance: Well nourished, in no apparent distress. Eyes: PERRLA, EOMs, conjunctiva no swelling or erythema Sinuses: No Frontal/maxillary tenderness ENT/Mouth: Ext aud canals clear, TMs without erythema, bulging. No erythema, swelling, or exudate on post pharynx.  Tonsils not swollen or erythematous. Hearing normal.  Neck: Supple, thyroid normal.  Respiratory: Respiratory effort normal, BS equal bilaterally without rales, rhonchi, wheezing or stridor.  Cardio: RRR with no MRGs. Brisk peripheral pulses without edema.  Abdomen: Soft, + BS.  Non tender, no guarding, rebound, hernias, masses. Lymphatics: Non tender without lymphadenopathy.  Musculoskeletal: Full  ROM, 5/5 strength, {PSY - GAIT AND STATION:22860} gait Skin: Warm, dry without rashes, lesions, ecchymosis.  Neuro: Cranial nerves intact. No cerebellar symptoms.  Psych: Awake and oriented X 3, normal affect,  Insight and Judgment appropriate.    Revonda Humphrey, NP 10:04 AM [facility]

## 2021-02-12 ENCOUNTER — Ambulatory Visit: Payer: 59 | Admitting: Nurse Practitioner

## 2021-02-16 NOTE — Progress Notes (Signed)
FOLLOW UP  Assessment and Plan:   Hypertension Pt has lost 27 pounds in the past year.  Bp has come down.  Will stop atenolol and monitor BP Monitor blood pressure at home; patient to call if consistently greater than 130/80 Continue DASH diet.   Reminder to go to the ER if any CP, SOB, nausea, dizziness, severe HA, changes vision/speech, left arm numbness and tingling and jaw pain. -CBC  Cholesterol Currently at goal;  Continue low cholesterol diet and exercise.  -Check lipid panel. -CMP   Hypothyroidism Will d/c Levothyroxine, start NP thyroid 60 mg QD Please take your thyroid medication greater than 30 min before breakfast, separated by at least 4 hours  from antacids, calcium, iron, and multivitamins. -TSH   Abnormal glucose Continue medication: Continue diet and exercise.  Perform daily foot/skin check, notify office of any concerning changes.  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 Further disposition pending results of labs. Discussed med's effects and SE's.   Over 30 minutes of exam, counseling, chart review, and critical decision making was performed.   Future Appointments  Date Time Provider Liberal  11/06/2021  2:00 PM Magda Bernheim, NP [facility] None    ----------------------------------------------------------------------------------------------------------------------  HPI 60 y.o. female  presents for 3 month follow up on hypertension, cholesterol, diabetes, weight and vitamin D deficiency.   BMI is Body mass index is 24.61 kg/m., she has been working on diet and exercise. Wt Readings from Last 3 Encounters:  02/18/21 145 lb 9.6 oz (66 kg)   11/06/20 154 lb 12.8 oz (70.2 kg)  11/07/19 172 lb 9.6 oz (78.3 kg)    Her blood pressure has been controlled at home, today their BP is BP: (!) 118/54. Her blood pressure is running low at home as well. She is exercising 6 days a week 2 hours a day cardio and weights. BP Readings from Last 3 Encounters:  02/18/21 (!) 118/54  11/06/20 132/80  11/07/19 140/82     She does workout. She denies chest pain, shortness of breath, dizziness.   She is not on cholesterol medication  Her cholesterol is not at goal. Increased red yeast rice. The cholesterol last visit was:   Lab Results  Component Value Date   CHOL 260 (H) 11/06/2020   HDL 50 11/06/2020   LDLCALC 176 (H) 11/06/2020   TRIG 188 (H) 11/06/2020   CHOLHDL 5.2 (H) 11/06/2020    She has been working on diet and exercise for abnormal glucose. Last A1C in the office was:  Lab Results  Component Value Date   HGBA1C 5.6 11/06/2020   Patient is on Vitamin D supplement.   Lab Results  Component Value Date   VD25OH 62 11/06/2020     She is on Levothyroxine 88 mcg QD but continues to have side effects of feeling cold Lab Results  Component Value Date   TSH 0.11 (L) 11/06/2020    She does note continual fatigue even with high exercise regimen. Does have a history of Hashimotos   Current Medications:  Current Outpatient Medications on File Prior to Visit  Medication Sig   Ascorbic Acid (VITAMIN C) 1000 MG tablet Take 1,000 mg by mouth every other day.  aspirin 81 MG chewable tablet Chew 1 tablet by mouth daily.   atenolol (TENORMIN) 25 MG tablet Take  1 tablet  Daily  for BP                                          /                         Take 1 tablet by mouth once daily   buPROPion (WELLBUTRIN XL) 150 MG 24 hr tablet Take  1 tablet  Daily  for Mood, Focus & Concentration   Calcium Carb-Cholecalciferol (CALCIUM 600-D PO) Take 1 capsule by mouth daily.   Cholecalciferol 125 MCG (5000 UT) TABS Take 2,000 Units by mouth.     Docusate Calcium (STOOL SOFTENER PO) Take by mouth.   Garlic 5003 MG CAPS Take by mouth.   levothyroxine (SYNTHROID) 88 MCG tablet Take 1 tablet (88 mcg total) by mouth daily.   metFORMIN (GLUCOPHAGE-XR) 500 MG 24 hr tablet Take  1 tablet Daily  for Hirsutism                                        /                         TAKE 1 TABLET BY MOUTH ONCE DAILY   Multiple Vitamin (MULTIVITAMIN) tablet Take 1 tablet by mouth daily.   Red Yeast Rice 600 MG CAPS Take by mouth. Taking 12000 mg   spironolactone (ALDACTONE) 100 MG tablet Take  1 tablet  Daily for Hirsutism                                        /                    TAKE 1 TABLET BY MOUTH ONCE DAILY   ferrous sulfate 325 (65 FE) MG tablet Take 1 tablet by mouth every other day. (Patient not taking: Reported on 02/18/2021)   No current facility-administered medications on file prior to visit.     Allergies: Not on File   Medical History: No past medical history on file. Family history- Reviewed and unchanged Social history- Reviewed and unchanged   Review of Systems:  Review of Systems  Constitutional:  Positive for malaise/fatigue. Negative for chills, fever and weight loss.  HENT:  Negative for congestion and hearing loss.   Eyes:  Negative for blurred vision and double vision.  Respiratory:  Negative for cough and shortness of breath.   Cardiovascular:  Negative for chest pain, palpitations, orthopnea and leg swelling.  Gastrointestinal:  Negative for abdominal pain, constipation, diarrhea, heartburn, nausea and vomiting.  Genitourinary:  Negative for dysuria.  Musculoskeletal:  Negative for falls, joint pain and myalgias.  Skin:  Negative for rash.  Neurological:  Negative for dizziness, tingling, tremors, loss of consciousness and headaches.  Psychiatric/Behavioral:  Negative for depression, memory loss and suicidal ideas.      Physical Exam: BP (!) 118/54   Pulse 78   Temp 97.7 F (36.5 C)   Wt 145 lb 9.6 oz (66 kg)    SpO2  98%   BMI 24.61 kg/m  Wt Readings from Last 3 Encounters:  02/18/21 145 lb 9.6 oz (66 kg)  11/06/20 154 lb 12.8 oz (70.2 kg)  11/07/19 172 lb 9.6 oz (78.3 kg)   General Appearance: Well nourished, in no apparent distress. Eyes: PERRLA, EOMs, conjunctiva no swelling or erythema Sinuses: No Frontal/maxillary tenderness ENT/Mouth: Ext aud canals clear, TMs without erythema, bulging. No erythema, swelling, or exudate on post pharynx.  Tonsils not swollen or erythematous. Hearing normal.  Neck: Supple, thyroid normal.  Respiratory: Respiratory effort normal, BS equal bilaterally without rales, rhonchi, wheezing or stridor.  Cardio: RRR with no MRGs. Brisk peripheral pulses without edema.  Abdomen: Soft, + BS.  Non tender, no guarding, rebound, hernias, masses. Lymphatics: Non tender without lymphadenopathy.  Musculoskeletal: Full ROM, 5/5 strength, Normal gait Skin: Warm, dry without rashes, lesions, ecchymosis.  Neuro: Cranial nerves intact. No cerebellar symptoms.  Psych: Awake and oriented X 3, normal affect, Insight and Judgment appropriate.    Magda Bernheim, NP 4:12 PM [facility]

## 2021-02-18 ENCOUNTER — Ambulatory Visit (INDEPENDENT_AMBULATORY_CARE_PROVIDER_SITE_OTHER): Payer: 59 | Admitting: Nurse Practitioner

## 2021-02-18 ENCOUNTER — Encounter: Payer: Self-pay | Admitting: Nurse Practitioner

## 2021-02-18 ENCOUNTER — Other Ambulatory Visit: Payer: Self-pay

## 2021-02-18 VITALS — BP 118/54 | HR 78 | Temp 97.7°F | Wt 145.6 lb

## 2021-02-18 DIAGNOSIS — F411 Generalized anxiety disorder: Secondary | ICD-10-CM

## 2021-02-18 DIAGNOSIS — I10 Essential (primary) hypertension: Secondary | ICD-10-CM

## 2021-02-18 DIAGNOSIS — R5382 Chronic fatigue, unspecified: Secondary | ICD-10-CM

## 2021-02-18 DIAGNOSIS — E039 Hypothyroidism, unspecified: Secondary | ICD-10-CM | POA: Diagnosis not present

## 2021-02-18 DIAGNOSIS — R7309 Other abnormal glucose: Secondary | ICD-10-CM | POA: Diagnosis not present

## 2021-02-18 DIAGNOSIS — E782 Mixed hyperlipidemia: Secondary | ICD-10-CM

## 2021-02-18 DIAGNOSIS — E559 Vitamin D deficiency, unspecified: Secondary | ICD-10-CM

## 2021-02-18 DIAGNOSIS — D649 Anemia, unspecified: Secondary | ICD-10-CM

## 2021-02-18 DIAGNOSIS — Z79899 Other long term (current) drug therapy: Secondary | ICD-10-CM

## 2021-02-18 DIAGNOSIS — E663 Overweight: Secondary | ICD-10-CM

## 2021-02-18 MED ORDER — THYROID 60 MG PO TABS: 60.0000 mg | ORAL_TABLET | Freq: Every day | ORAL | 3 refills | Status: DC

## 2021-02-18 NOTE — Patient Instructions (Signed)

## 2021-02-19 LAB — COMPLETE METABOLIC PANEL WITH GFR
AG Ratio: 1.8 (calc) (ref 1.0–2.5)
ALT: 10 U/L (ref 6–29)
AST: 16 U/L (ref 10–35)
Albumin: 4.2 g/dL (ref 3.6–5.1)
Alkaline phosphatase (APISO): 69 U/L (ref 37–153)
BUN: 15 mg/dL (ref 7–25)
CO2: 32 mmol/L (ref 20–32)
Calcium: 9.6 mg/dL (ref 8.6–10.4)
Chloride: 100 mmol/L (ref 98–110)
Creat: 0.96 mg/dL (ref 0.50–1.05)
Globulin: 2.4 g/dL (calc) (ref 1.9–3.7)
Glucose, Bld: 97 mg/dL (ref 65–99)
Potassium: 4.8 mmol/L (ref 3.5–5.3)
Sodium: 140 mmol/L (ref 135–146)
Total Bilirubin: 0.2 mg/dL (ref 0.2–1.2)
Total Protein: 6.6 g/dL (ref 6.1–8.1)
eGFR: 67 mL/min/{1.73_m2} (ref >=60)

## 2021-02-19 LAB — LIPID PANEL
Cholesterol: 201 mg/dL — ABNORMAL HIGH (ref <200)
HDL: 43 mg/dL — ABNORMAL LOW (ref >=50)
LDL Cholesterol (Calc): 118 mg/dL (calc) — ABNORMAL HIGH
Non-HDL Cholesterol (Calc): 158 mg/dL (calc) — ABNORMAL HIGH (ref <130)
Total CHOL/HDL Ratio: 4.7 (calc) (ref <5.0)
Triglycerides: 299 mg/dL — ABNORMAL HIGH (ref <150)

## 2021-02-19 LAB — CBC WITH DIFFERENTIAL/PLATELET
Absolute Monocytes: 605 cells/uL (ref 200–950)
Basophils Absolute: 22 cells/uL (ref 0–200)
Basophils Relative: 0.4 %
Eosinophils Absolute: 39 cells/uL (ref 15–500)
Eosinophils Relative: 0.7 %
HCT: 40.1 % (ref 35.0–45.0)
Hemoglobin: 13.2 g/dL (ref 11.7–15.5)
Lymphs Abs: 1938 cells/uL (ref 850–3900)
MCH: 27.9 pg (ref 27.0–33.0)
MCHC: 32.9 g/dL (ref 32.0–36.0)
MCV: 84.8 fL (ref 80.0–100.0)
MPV: 10 fL (ref 7.5–12.5)
Monocytes Relative: 10.8 %
Neutro Abs: 2996 cells/uL (ref 1500–7800)
Neutrophils Relative %: 53.5 %
Platelets: 382 10*3/uL (ref 140–400)
RBC: 4.73 10*6/uL (ref 3.80–5.10)
RDW: 15.5 % — ABNORMAL HIGH (ref 11.0–15.0)
Total Lymphocyte: 34.6 %
WBC: 5.6 10*3/uL (ref 3.8–10.8)

## 2021-02-19 LAB — SEDIMENTATION RATE: Sed Rate: 11 mm/h (ref 0–30)

## 2021-02-19 LAB — ANA: Anti Nuclear Antibody (ANA): NEGATIVE

## 2021-02-19 LAB — TSH: TSH: 11.22 mIU/L — ABNORMAL HIGH (ref 0.40–4.50)

## 2021-02-22 LAB — HM MAMMOGRAPHY

## 2021-03-04 ENCOUNTER — Encounter: Payer: Self-pay | Admitting: Nurse Practitioner

## 2021-04-10 ENCOUNTER — Encounter: Payer: Self-pay | Admitting: Internal Medicine

## 2021-06-17 ENCOUNTER — Encounter: Payer: Self-pay | Admitting: Nurse Practitioner

## 2021-06-17 ENCOUNTER — Other Ambulatory Visit: Payer: Self-pay | Admitting: Nurse Practitioner

## 2021-06-17 DIAGNOSIS — E039 Hypothyroidism, unspecified: Secondary | ICD-10-CM

## 2021-06-25 ENCOUNTER — Other Ambulatory Visit: Payer: Self-pay

## 2021-06-25 ENCOUNTER — Other Ambulatory Visit (INDEPENDENT_AMBULATORY_CARE_PROVIDER_SITE_OTHER): Payer: Self-pay

## 2021-06-25 DIAGNOSIS — E039 Hypothyroidism, unspecified: Secondary | ICD-10-CM

## 2021-06-26 ENCOUNTER — Other Ambulatory Visit: Payer: Self-pay | Admitting: Nurse Practitioner

## 2021-06-26 ENCOUNTER — Other Ambulatory Visit: Payer: Self-pay

## 2021-06-26 DIAGNOSIS — E039 Hypothyroidism, unspecified: Secondary | ICD-10-CM

## 2021-06-26 DIAGNOSIS — Z0001 Encounter for general adult medical examination with abnormal findings: Secondary | ICD-10-CM

## 2021-06-26 DIAGNOSIS — F411 Generalized anxiety disorder: Secondary | ICD-10-CM

## 2021-06-26 LAB — TSH: TSH: 41.07 mIU/L — ABNORMAL HIGH (ref 0.40–4.50)

## 2021-06-26 MED ORDER — THYROID 120 MG PO TABS: 120.0000 mg | ORAL_TABLET | Freq: Every day | ORAL | 3 refills | Status: DC

## 2021-06-26 MED ORDER — BUPROPION HCL ER (XL) 150 MG PO TB24: ORAL_TABLET | ORAL | 1 refills | Status: DC

## 2021-07-17 ENCOUNTER — Ambulatory Visit (INDEPENDENT_AMBULATORY_CARE_PROVIDER_SITE_OTHER): Payer: Self-pay | Admitting: Nurse Practitioner

## 2021-07-17 ENCOUNTER — Encounter: Payer: Self-pay | Admitting: Nurse Practitioner

## 2021-07-17 VITALS — BP 148/82 | HR 94 | Temp 97.9°F | Wt 134.6 lb

## 2021-07-17 DIAGNOSIS — R319 Hematuria, unspecified: Secondary | ICD-10-CM

## 2021-07-17 DIAGNOSIS — B3731 Acute candidiasis of vulva and vagina: Secondary | ICD-10-CM

## 2021-07-17 DIAGNOSIS — R350 Frequency of micturition: Secondary | ICD-10-CM

## 2021-07-17 DIAGNOSIS — Z113 Encounter for screening for infections with a predominantly sexual mode of transmission: Secondary | ICD-10-CM

## 2021-07-17 DIAGNOSIS — E039 Hypothyroidism, unspecified: Secondary | ICD-10-CM

## 2021-07-17 DIAGNOSIS — R21 Rash and other nonspecific skin eruption: Secondary | ICD-10-CM

## 2021-07-17 DIAGNOSIS — Z7251 High risk heterosexual behavior: Secondary | ICD-10-CM

## 2021-07-17 DIAGNOSIS — R300 Dysuria: Secondary | ICD-10-CM

## 2021-07-17 MED ORDER — NITROFURANTOIN MONOHYD MACRO 100 MG PO CAPS: 100.0000 mg | ORAL_CAPSULE | Freq: Two times a day (BID) | ORAL | 0 refills | Status: AC

## 2021-07-17 NOTE — Progress Notes (Signed)
Assessment and Plan: ? ?Crystal Guzman was seen today for an episodic visit. ? ?Diagnoses and all order for this visit: ? ?1. Screening examination for STD (sexually transmitted disease) ?Discussed safe sex practices. ? ?- RPR ?- HIV Antibody (routine testing w rflx) ?- HSV(herpes simplex vrs) 1+2 ab-IgG ?- C. trachomatis/N. gonorrhoeae RNA ?- WET PREP BY MOLECULAR PROBE ? ?2. Rash and nonspecific skin eruption ? ?- RPR ?- HSV(herpes simplex vrs) 1+2 ab-IgG ? ?3. Hematuria, unspecified type ? ?- C. trachomatis/N. gonorrhoeae RNA ?- WET PREP BY MOLECULAR PROBE ? ?4. Dysuria ?Discussed prophylactic tmt ?Patient would like to await confirmation. ? ?- RPR ?- C. trachomatis/N. gonorrhoeae RNA ?- WET PREP BY MOLECULAR PROBE ?- Urinalysis w microscopic + reflex cultur ? ?5. Urinary frequency ? ?- RPR ?- C. trachomatis/N. gonorrhoeae RNA ?- WET PREP BY MOLECULAR PROBE ?- Urinalysis w microscopic + reflex cultur ? ?6. Vaginal candidiasis ?Discussed probiotic ? ?- RPR ?- HSV(herpes simplex vrs) 1+2 ab-IgG ? ?7. High risk heterosexual behavior ?Discussed safe sex practices ?Pelvic exam performed. ? ?- RPR ?- HIV Antibody (routine testing w rflx) ?- HSV(herpes simplex vrs) 1+2 ab-IgG ?- C. trachomatis/N. gonorrhoeae RNA ?- WET PREP BY MOLECULAR PROBE ?- Urinalysis w microscopic + reflex cultur ? ? ? ?Continue to monitor for any increase in fever, chills, N/V, diarrhea, changes to bowel habits, blood in stool, urine, abdominal cramping unrelieved by medication.  Notify office for further evaluation and treatment, questions or concerns if s/s fail to improve. The risks and benefits of my recommendations, as well as other treatment options were discussed with the patient today. Questions were answered. ? ?Further disposition pending results of labs. Discussed med's effects and SE's.   ? ?Over 15 minutes of exam, counseling, chart review, and critical decision making was performed.  ? ?Future Appointments  ?Date Time Provider  Department Center  ?07/25/2021  2:00 PM [facility] NURSE [facility] None  ?11/06/2021  2:00 PM Revonda Humphrey, NP [facility] None  ? ? ?------------------------------------------------------------------------------------------------------------------ ? ? ?HPI ?BP (!) 148/82   Pulse 94   Temp 97.9 ?F (36.6 ?C)   Wt 134 lb 9.6 oz (61.1 kg)   SpO2 96%   BMI 22.75 kg/m?  ? ?61 y.o.female presents for STD screening after recently having her first sexual encounter in 7 years x1 week ago that was unprotected.  Today she reports vaginal rash with discharge, mild hematuria, itching, dysuria, urinary frequency and urgency.  Reports shaving the area before the sexual encounter which may have encouraged a rash.  She also walked several miles yesterday which she feels as though has aggravated the rash. Notices blood tinged red to pink stain when wiping. She has not taken any OTC medication or treatments. Denies fever, chills, N/V, abdominal pain.  She is requesting to have a full STD evaluation.  ? ?Allergies: Not on File ?Medical History:  ?has Anxiety state; Fibromyalgia; Hereditary and idiopathic peripheral neuropathy; Hirsutism; Hypertension, benign; Hypothyroidism (acquired); Idiopathic urticaria; Insomnia; Low back pain; Mixed hyperlipidemia; Obesity; and Vitamin D deficiency on their problem list. ?Surgical History:  ?She  has a past surgical history that includes Abdominal hysterectomy (1994); Total thyroidectomy (2001); Tonsilectomy/adenoidectomy with myringotomy (1967); and Carpal tunnel with cubital tunnel (Bilateral, 2002). ?Family History:  ?Herfamily history includes Angina in her mother; Arthritis in her father and mother; Colitis in her mother; Colon cancer in her mother; Gout in her father; Heart attack in her father; Heart disease in her father; Hypertension in her father and mother; Lung  cancer in her mother; Migraines in her father; Stroke in her father; Tuberculosis in her sister. ?Social History:  ?  reports that she quit smoking about 21 years ago. Her smoking use included cigarettes. She has never used smokeless tobacco. She reports that she does not drink alcohol and does not use drugs.  ? ? ?Current Outpatient Medications on File Prior to Visit  ?Medication Sig  ? Ascorbic Acid (VITAMIN C) 1000 MG tablet Take 1,000 mg by mouth every other day.  ? aspirin 81 MG chewable tablet Chew 1 tablet by mouth daily.  ? buPROPion (WELLBUTRIN XL) 150 MG 24 hr tablet Take  1 tablet  Daily  for Mood, Focus & Concentration  ? Calcium Carb-Cholecalciferol (CALCIUM 600-D PO) Take 1 capsule by mouth daily.  ? Cholecalciferol 125 MCG (5000 UT) TABS Take 2,000 Units by mouth.   ? Docusate Calcium (STOOL SOFTENER PO) Take by mouth.  ? Garlic 1200 MG CAPS Take by mouth.  ? metFORMIN (GLUCOPHAGE-XR) 500 MG 24 hr tablet Take  1 tablet Daily  for Hirsutism                                        /                         TAKE 1 TABLET BY MOUTH ONCE DAILY  ? Multiple Vitamin (MULTIVITAMIN) tablet Take 1 tablet by mouth daily.  ? Red Yeast Rice 600 MG CAPS Take by mouth. Taking 34287 mg  ? spironolactone (ALDACTONE) 100 MG tablet Take  1 tablet  Daily for Hirsutism                                        /                    TAKE 1 TABLET BY MOUTH ONCE DAILY  ? thyroid (NP THYROID) 120 MG tablet Take 1 tablet (120 mg total) by mouth daily before breakfast.  ? atenolol (TENORMIN) 25 MG tablet Take  1 tablet  Daily  for BP                                          /                         Take 1 tablet by mouth once daily (Patient not taking: Reported on 07/17/2021)  ? ?No current facility-administered medications on file prior to visit.  ? ? ?ROS: all negative except what is noted in the HPI.  ? ?ROS  ? ?Physical Exam: ? ?BP (!) 148/82   Pulse 94   Temp 97.9 ?F (36.6 ?C)   Wt 134 lb 9.6 oz (61.1 kg)   SpO2 96%   BMI 22.75 kg/m?  ? ?General Appearance: NAD.  Awake, conversant and cooperative. ?Eyes: PERRLA, EOMs intact.  Sclera white.   Conjunctiva without erythema. ?Sinuses: No frontal/maxillary tenderness.  No nasal discharge. Nares patent.  ?ENT/Mouth: Ext aud canals clear.  Bilateral TMs w/DOL and without erythema or bulging. Hearing intact.  Posterior pharynx without swelling or exudate.  Tonsils without swelling or  erythema.  ?Neck: Supple.  No masses, nodules or thyromegaly. ?Respiratory: Effort is regular with non-labored breathing. Breath sounds are equal bilaterally without rales, rhonchi, wheezing or stridor.  ?Cardio: RRR with no MRGs. Brisk peripheral pulses without edema.  ?Abdomen: Tender to palpation suprapubic area. ?Lymphatics: Non tender without lymphadenopathy.  ?Musculoskeletal: Full ROM, 5/5 strength, normal ambulation.  No clubbing or cyanosis. ?Skin: Appropriate color for ethnicity. Warm without rashes, lesions, ecchymosis, ulcers.  ?Neuro: CN II-XII grossly normal. Normal muscle tone without cerebellar symptoms and intact sensation.   ?Psych: AO X 3,  appropriate mood and affect, insight and judgment.  ?Genitourinary:  Mildly erythematous.  Green, milky discharge.  ? ?Adela GlimpseNYA Kentarius Partington, NP ?2:14 PM ?[facility] ? ?

## 2021-07-18 ENCOUNTER — Other Ambulatory Visit: Payer: Self-pay | Admitting: Nurse Practitioner

## 2021-07-18 ENCOUNTER — Encounter: Payer: Self-pay | Admitting: Nurse Practitioner

## 2021-07-18 DIAGNOSIS — N760 Acute vaginitis: Secondary | ICD-10-CM

## 2021-07-18 DIAGNOSIS — E039 Hypothyroidism, unspecified: Secondary | ICD-10-CM

## 2021-07-18 MED ORDER — METRONIDAZOLE 500 MG PO TABS: 500.0000 mg | ORAL_TABLET | Freq: Two times a day (BID) | ORAL | 0 refills | Status: DC

## 2021-07-18 MED ORDER — THYROID 30 MG PO TABS
30.0000 mg | ORAL_TABLET | Freq: Every day | ORAL | 0 refills | Status: DC
Start: 2021-07-18 — End: 2021-08-12

## 2021-07-19 LAB — CULTURE INDICATED

## 2021-07-19 LAB — WET PREP BY MOLECULAR PROBE
Candida species: NOT DETECTED
MICRO NUMBER:: 13351506
SPECIMEN QUALITY:: ADEQUATE
Trichomonas vaginosis: NOT DETECTED

## 2021-07-19 LAB — URINALYSIS W MICROSCOPIC + REFLEX CULTURE
Bacteria, UA: NONE SEEN /HPF
Bilirubin Urine: NEGATIVE
Glucose, UA: NEGATIVE
Hgb urine dipstick: NEGATIVE
Hyaline Cast: NONE SEEN /LPF
Ketones, ur: NEGATIVE
Nitrites, Initial: NEGATIVE
Protein, ur: NEGATIVE
RBC / HPF: NONE SEEN /HPF (ref 0–2)
Specific Gravity, Urine: 1.008 (ref 1.001–1.035)
Squamous Epithelial / HPF: NONE SEEN /HPF (ref <=5)
pH: 5.5 (ref 5.0–8.0)

## 2021-07-19 LAB — TSH: TSH: 0.12 mIU/L — ABNORMAL LOW (ref 0.40–4.50)

## 2021-07-19 LAB — URINE CULTURE
MICRO NUMBER:: 13348331
SPECIMEN QUALITY:: ADEQUATE

## 2021-07-19 LAB — C. TRACHOMATIS/N. GONORRHOEAE RNA
C. trachomatis RNA, TMA: NOT DETECTED
N. gonorrhoeae RNA, TMA: NOT DETECTED

## 2021-07-19 LAB — RPR: RPR Ser Ql: NONREACTIVE

## 2021-07-19 LAB — HIV ANTIBODY (ROUTINE TESTING W REFLEX): HIV 1&2 Ab, 4th Generation: NONREACTIVE

## 2021-07-19 LAB — HSV(HERPES SIMPLEX VRS) I + II AB-IGG
HAV 1 IGG,TYPE SPECIFIC AB: <0.9 index
HSV 2 IGG,TYPE SPECIFIC AB: <0.9 index

## 2021-07-22 NOTE — Patient Instructions (Signed)
Safe Sex Practicing safe sex means taking steps before and during sex to reduce your risk of: Getting an STI (sexually transmitted infection). Giving your partner an STI. Unwanted or unplanned pregnancy. How to practice safe sex Ways you can practice safe sex  Limit your sexual partners to only one partner who is having sex with only you. Avoid using alcohol and drugs before having sex. Alcohol and drugs can affect your judgment. Before having sex with a new partner: Talk to your partner about past partners, past STIs, and drug use. Get screened for STIs and discuss the results with your partner. Ask your partner to get screened too. Check your body regularly for sores, blisters, rashes, or unusual discharge. If you notice any of these problems, visit your health care provider. Avoid sexual contact if you have symptoms of an infection or you are being treated for an STI. While having sex, use a condom. Make sure to: Use a condom every time you have vaginal, oral, or anal sex. Both females and males should wear condoms during oral sex. Keep condoms in place from the beginning to the end of sexual activity. Use a latex condom, if possible. Latex condoms offer the best protection. Use only water-based lubricants with a condom. Using petroleum-based lubricants or oils will weaken the condom and increase the chance that it will break. Ways your health care provider can help you practice safe sex  See your health care provider for regular screenings, exams, and tests for STIs. Talk with your health care provider about what kind of birth control (contraception) is best for you. Get vaccinated against hepatitis B and human papillomavirus (HPV). If you are at risk of being infected with HIV (human immunodeficiency virus), talk with your health care provider about taking a prescription medicine to prevent HIV infection. You are at risk for HIV if you: Are a man who has sex with other men. Are  sexually active with more than one partner. Take drugs by injection. Have a sex partner who has HIV. Have unprotected sex. Have sex with someone who has sex with both men and women. Have had an STI. Follow these instructions at home: Take over-the-counter and prescription medicines only as told by your health care provider. Keep all follow-up visits. This is important. Where to find more information Centers for Disease Control and Prevention: www.cdc.gov Planned Parenthood: www.plannedparenthood.org Office on Women's Health: www.womenshealth.gov Summary Practicing safe sex means taking steps before and during sex to reduce your risk getting an STI, giving your partner an STI, and having an unwanted or unplanned pregnancy. Before having sex with a new partner, talk to your partner about past partners, past STIs, and drug use. Use a condom every time you have vaginal, oral, or anal sex. Both females and males should wear condoms during oral sex. Check your body regularly for sores, blisters, rashes, or unusual discharge. If you notice any of these problems, visit your health care provider. See your health care provider for regular screenings, exams, and tests for STIs. This information is not intended to replace advice given to you by your health care provider. Make sure you discuss any questions you have with your health care provider. Document Revised: 08/08/2019 Document Reviewed: 08/08/2019 Elsevier Patient Education  2023 [employer]  

## 2021-07-25 ENCOUNTER — Ambulatory Visit: Payer: Self-pay

## 2021-08-07 ENCOUNTER — Telehealth: Payer: Self-pay | Admitting: Nurse Practitioner

## 2021-08-08 ENCOUNTER — Other Ambulatory Visit: Payer: Self-pay

## 2021-08-08 DIAGNOSIS — E039 Hypothyroidism, unspecified: Secondary | ICD-10-CM

## 2021-08-09 LAB — TSH: TSH: 52.57 mIU/L — ABNORMAL HIGH (ref 0.40–4.50)

## 2021-08-09 NOTE — Progress Notes (Signed)
Many providers do not like Armour, I myself being a hypothyroid patient know that I feel better on this medication  When drawing her labs though it needs to be not to soon after taking dose so would recommend her not taking med the day she is coming for draw.  If you can not get her stabilized I would return to levothyroxine

## 2021-08-12 MED ORDER — THYROID 120 MG PO TABS: 120.0000 mg | ORAL_TABLET | Freq: Every day | ORAL | 3 refills | Status: DC

## 2021-11-05 NOTE — Progress Notes (Unsigned)
Complete Physical  Assessment and Plan: Crystal Guzman was seen today for annual exam.  Diagnoses and all orders for this visit:  Encounter for general adult medical examination with abnormal findings  Due Annually- Pt does not have insurance and requests minimal labs -     CBC with Differential/Platelet -     COMPLETE METABOLIC PANEL WITH GFR -     Lipid panel -     TSH  Hypertension, benign -     CBC with Differential/Platelet -     EKG 12-Lead  - continue medications, DASH diet, exercise and monitor at home. Call if greater than 130/80.    Mixed hyperlipidemia -     COMPLETE METABOLIC PANEL WITH GFR -     Lipid panel - Continue diet and exercise   Hypothyroidism (acquired) -     TSH - Continue current levothyroxine dosage and will adjust pending result . Reminded to take medication first thing in the morning and wait 30 minutes before taking food, drink and other medications  Anemia, unspecified type   Check CBC  Continue ferrous sulfate 329m daily  Insomnia, unspecified type  Continue Xanax as needed  Discussed good sleep hygiene, difficult with taking care of 924year old father with dementia  Fibromyalgia  Flexeril as neeeded, continue diet and exercise  Abnormal glucose - Continue diet and exercise Declines A1c  Numbness of perineum Has occurred after a long motorcycle ride Monitor and if sensation does not improve will further evaluate  Medication management -     CBC with Differential/Platelet -     COMPLETE METABOLIC PANEL WITH GFR -     Lipid panel -     TSH  Anxiety state -     buPROPion (WELLBUTRIN XL) 150 MG 24 hr tablet; Take 1 tablet (150 mg total) by mouth every morning. Continue relaxation techniques, practice good sleep hygiene  Screening for colon cancer Pt declines- does not have health insurance  Screening for osteoporosis Pt declines does not have insurance     Discussed med's effects and SE's. Screening labs and tests as requested with  regular follow-up as recommended. Over 40 minutes of exam, counseling, chart review, and complex, high level critical decision making was performed this visit.   HPI  61y.o. female  presents for a complete physical and follow up for has Anxiety state; Fibromyalgia; Hereditary and idiopathic peripheral neuropathy; Hirsutism; Hypertension, benign; Hypothyroidism (acquired); Idiopathic urticaria; Insomnia; Low back pain; Mixed hyperlipidemia; Obesity; and Vitamin D deficiency on their problem list..  Her blood pressure has been controlled at home, today their BP is BP: (!) 106/58 BP Readings from Last 3 Encounters:  11/06/21 (!) 106/58  07/17/21 (!) 148/82  02/18/21 (!) 118/54    She does workout. She denies chest pain, shortness of breath, dizziness.  She is walking 6-10 miles a day BMI is Body mass index is 22.71 kg/m., she has been working on diet and exercise. Wt Readings from Last 3 Encounters:  11/06/21 134 lb 6.4 oz (61 kg)  07/17/21 134 lb 9.6 oz (61.1 kg)  02/18/21 145 lb 9.6 oz (66 kg)   She rode on motorcycle ride for a charity ride for several hours 4 days ago that night developed numbness of vulva and perineum.    She is not on cholesterol medication and denies myalgias. Her cholesterol is not at goal. The cholesterol last visit was:   Lab Results  Component Value Date   CHOL 201 (H) 02/18/2021   HDL  43 (L) 02/18/2021   LDLCALC 118 (H) 02/18/2021   TRIG 299 (H) 02/18/2021   CHOLHDL 4.7 02/18/2021    She has been working on diet and exercise for prediabetes, she is on bASA, she is not on ACE/ARB and denies hyperglycemia. Last A1C in the office was:  Lab Results  Component Value Date   HGBA1C 5.6 11/06/2020   Pt has been taking spironolactone and Metformin for hirsuitism but does not notice any difference with use of medication. Will check testosterone and A1C today.   Last GFR: Lab Results  Component Value Date   EGFR 67 02/18/2021     Patient is on Vitamin D  supplement.   Lab Results  Component Value Date   VD25OH 5 11/06/2020      Current Medications:  Current Outpatient Medications on File Prior to Visit  Medication Sig Dispense Refill   Ascorbic Acid (VITAMIN C) 1000 MG tablet Take 1,000 mg by mouth every other day.     aspirin 81 MG chewable tablet Chew 1 tablet by mouth daily.     buPROPion (WELLBUTRIN XL) 150 MG 24 hr tablet Take  1 tablet  Daily  for Mood, Focus & Concentration 90 tablet 1   Calcium Carb-Cholecalciferol (CALCIUM 600-D PO) Take 1 capsule by mouth daily.     Cholecalciferol 125 MCG (5000 UT) TABS Take 2,000 Units by mouth.      Docusate Calcium (STOOL SOFTENER PO) Take by mouth.     metFORMIN (GLUCOPHAGE-XR) 500 MG 24 hr tablet Take  1 tablet Daily  for Hirsutism                                        /                         TAKE 1 TABLET BY MOUTH ONCE DAILY 90 tablet 3   Multiple Vitamin (MULTIVITAMIN) tablet Take 1 tablet by mouth daily.     Red Yeast Rice 600 MG CAPS Take by mouth. Taking 12000 mg     spironolactone (ALDACTONE) 100 MG tablet Take  1 tablet  Daily for Hirsutism                                        /                    TAKE 1 TABLET BY MOUTH ONCE DAILY 90 tablet 3   thyroid (NP THYROID) 120 MG tablet Take 1 tablet (120 mg total) by mouth daily before breakfast. 90 tablet 3   atenolol (TENORMIN) 25 MG tablet Take  1 tablet  Daily  for BP                                          /                         Take 1 tablet by mouth once daily (Patient not taking: Reported on 07/17/2021) 90 tablet 3   Garlic 4503 MG CAPS Take by mouth. (Patient not taking: Reported on 11/06/2021)     metroNIDAZOLE (FLAGYL) 500 MG  tablet Take 1 tablet (500 mg total) by mouth 2 (two) times daily. (Patient not taking: Reported on 11/06/2021) 14 tablet 0   No current facility-administered medications on file prior to visit.   Allergies:  Not on File Medical History:  She has Anxiety state; Fibromyalgia; Hereditary and idiopathic  peripheral neuropathy; Hirsutism; Hypertension, benign; Hypothyroidism (acquired); Idiopathic urticaria; Insomnia; Low back pain; Mixed hyperlipidemia; Obesity; and Vitamin D deficiency on their problem list. Health Maintenance:   Immunization History  Administered Date(s) Administered   Influenza-Unspecified 11/29/2018   Tdap 03/30/2018, 11/07/2019   Zoster Recombinat (Shingrix) 11/12/2016, 01/21/2017    Tetanus:11/07/19 Pneumovax:N/A Prevnar 13:  Flu vaccine:  Shingrix: 11/12/16, 01/21/17 LMP: Pap: 20 years ago, Had TAH  MGM: 12/2018 negative repeat 1 year, will schedule DEXA: schedule, declines Colonoscopy: 2016 due 2021 decline EGD:  Last Dental Exam:03/2020 Last Eye Exam: Dr. Sharen Counter 03/2019 Patient Care Team: Unk Pinto, MD as PCP - General (Internal Medicine)  Surgical History:  She has a past surgical history that includes Abdominal hysterectomy (1994); Total thyroidectomy (2001); Tonsilectomy/adenoidectomy with myringotomy (1967); and Carpal tunnel with cubital tunnel (Bilateral, 2002). Family History:  Herfamily history includes Angina in her mother; Arthritis in her father and mother; Colitis in her mother; Colon cancer in her mother; Gout in her father; Heart attack in her father; Heart disease in her father; Hypertension in her father and mother; Lung cancer in her mother; Migraines in her father; Stroke in her father; Tuberculosis in her sister. Social History:  She reports that she quit smoking about 22 years ago. Her smoking use included cigarettes. She has never used smokeless tobacco. She reports that she does not drink alcohol and does not use drugs.  Review of Systems: Review of Systems  Constitutional:  Negative for chills and fever.  HENT:  Negative for congestion, hearing loss, sinus pain, sore throat and tinnitus.   Eyes:  Negative for blurred vision and double vision.  Respiratory:  Negative for cough, hemoptysis, sputum production, shortness of breath  and wheezing.   Cardiovascular:  Negative for chest pain, palpitations and leg swelling.  Gastrointestinal:  Negative for abdominal pain, constipation, diarrhea, heartburn, nausea and vomiting.  Genitourinary:  Negative for dysuria and urgency.  Musculoskeletal:  Negative for back pain, falls, joint pain, myalgias and neck pain.  Skin:  Negative for rash.  Neurological:  Negative for dizziness, tingling, tremors, weakness and headaches.       Some sensation loss of toes and bottoms of feet New numbness of perineum  Endo/Heme/Allergies:  Does not bruise/bleed easily.  Psychiatric/Behavioral:  Negative for depression and suicidal ideas. The patient is not nervous/anxious and does not have insomnia.     Physical Exam: Estimated body mass index is 22.71 kg/m as calculated from the following:   Height as of this encounter: 5' 4.5" (1.638 m).   Weight as of this encounter: 134 lb 6.4 oz (61 kg). BP (!) 106/58   Pulse 80   Temp 97.9 F (36.6 C)   Ht 5' 4.5" (1.638 m)   Wt 134 lb 6.4 oz (61 kg)   SpO2 97%   BMI 22.71 kg/m  General Appearance: Well nourished, in no apparent distress.  Eyes: PERRLA, EOMs, conjunctiva no swelling or erythema, normal fundi and vessels.  Sinuses: No Frontal/maxillary tenderness  ENT/Mouth: Ext aud canals clear, normal light reflex with TMs without erythema, bulging. Good dentition. No erythema, swelling, or exudate on post pharynx. Tonsils not swollen or erythematous. Hearing normal.  Neck: Supple, thyroid normal.  No bruits  Respiratory: Respiratory effort normal, BS equal bilaterally without rales, rhonchi, wheezing or stridor.  Cardio: RRR without murmurs, rubs or gallops. Brisk peripheral pulses without edema.  Chest: symmetric, with normal excursions and percussion.  Breasts: Deferred Abdomen: Positive bowel sounds all 4 quadrants, Soft, nontender, no guarding, rebound, hernias, masses, or organomegaly.  Lymphatics: Non tender without lymphadenopathy.   Genitourinary:Deferred Musculoskeletal: Full ROM all peripheral extremities,5/5 strength, and normal gait.  Skin: Warm, dry without rashes, lesions, ecchymosis. Great toenail bilaterally blackened, loose Neuro: Cranial nerves intact, reflexes equal bilaterally. Normal muscle tone, no cerebellar symptoms. Decreased sensation of toes, normal from foot up Psych: Awake and oriented X 3, normal affect, Insight and Judgment appropriate.   EKG: Defer per pt request   Terique Kawabata E Carlye Grippe 1:58 PM [facility]

## 2021-11-06 ENCOUNTER — Ambulatory Visit (INDEPENDENT_AMBULATORY_CARE_PROVIDER_SITE_OTHER): Payer: Self-pay | Admitting: Nurse Practitioner

## 2021-11-06 ENCOUNTER — Encounter: Payer: Self-pay | Admitting: Nurse Practitioner

## 2021-11-06 VITALS — BP 106/58 | HR 80 | Temp 97.9°F | Ht 64.5 in | Wt 134.4 lb

## 2021-11-06 DIAGNOSIS — G4700 Insomnia, unspecified: Secondary | ICD-10-CM

## 2021-11-06 DIAGNOSIS — E782 Mixed hyperlipidemia: Secondary | ICD-10-CM

## 2021-11-06 DIAGNOSIS — Z79899 Other long term (current) drug therapy: Secondary | ICD-10-CM

## 2021-11-06 DIAGNOSIS — R7309 Other abnormal glucose: Secondary | ICD-10-CM

## 2021-11-06 DIAGNOSIS — I10 Essential (primary) hypertension: Secondary | ICD-10-CM

## 2021-11-06 DIAGNOSIS — E559 Vitamin D deficiency, unspecified: Secondary | ICD-10-CM

## 2021-11-06 DIAGNOSIS — Z0000 Encounter for general adult medical examination without abnormal findings: Secondary | ICD-10-CM

## 2021-11-06 DIAGNOSIS — E039 Hypothyroidism, unspecified: Secondary | ICD-10-CM

## 2021-11-06 DIAGNOSIS — M797 Fibromyalgia: Secondary | ICD-10-CM

## 2021-11-06 DIAGNOSIS — D649 Anemia, unspecified: Secondary | ICD-10-CM

## 2021-11-06 DIAGNOSIS — Z0001 Encounter for general adult medical examination with abnormal findings: Secondary | ICD-10-CM

## 2021-11-06 DIAGNOSIS — R200 Anesthesia of skin: Secondary | ICD-10-CM

## 2021-11-06 DIAGNOSIS — F411 Generalized anxiety disorder: Secondary | ICD-10-CM

## 2021-11-07 LAB — COMPLETE METABOLIC PANEL WITH GFR
AG Ratio: 1.6 (calc) (ref 1.0–2.5)
ALT: 15 U/L (ref 6–29)
AST: 18 U/L (ref 10–35)
Albumin: 4.1 g/dL (ref 3.6–5.1)
Alkaline phosphatase (APISO): 73 U/L (ref 37–153)
BUN: 9 mg/dL (ref 7–25)
CO2: 30 mmol/L (ref 20–32)
Calcium: 10 mg/dL (ref 8.6–10.4)
Chloride: 104 mmol/L (ref 98–110)
Creat: 0.72 mg/dL (ref 0.50–1.05)
Globulin: 2.5 g/dL (calc) (ref 1.9–3.7)
Glucose, Bld: 92 mg/dL (ref 65–99)
Potassium: 4.6 mmol/L (ref 3.5–5.3)
Sodium: 142 mmol/L (ref 135–146)
Total Bilirubin: 0.3 mg/dL (ref 0.2–1.2)
Total Protein: 6.6 g/dL (ref 6.1–8.1)
eGFR: 94 mL/min/{1.73_m2} (ref >=60)

## 2021-11-07 LAB — LIPID PANEL
Cholesterol: 186 mg/dL (ref <200)
HDL: 47 mg/dL — ABNORMAL LOW (ref >=50)
LDL Cholesterol (Calc): 112 mg/dL (calc) — ABNORMAL HIGH
Non-HDL Cholesterol (Calc): 139 mg/dL (calc) — ABNORMAL HIGH (ref <130)
Total CHOL/HDL Ratio: 4 (calc) (ref <5.0)
Triglycerides: 154 mg/dL — ABNORMAL HIGH (ref <150)

## 2021-11-07 LAB — CBC WITH DIFFERENTIAL/PLATELET
Absolute Monocytes: 818 cells/uL (ref 200–950)
Basophils Absolute: 33 cells/uL (ref 0–200)
Basophils Relative: 0.5 %
Eosinophils Absolute: 218 cells/uL (ref 15–500)
Eosinophils Relative: 3.3 %
HCT: 36.3 % (ref 35.0–45.0)
Hemoglobin: 11.9 g/dL (ref 11.7–15.5)
Lymphs Abs: 1934 cells/uL (ref 850–3900)
MCH: 27.7 pg (ref 27.0–33.0)
MCHC: 32.8 g/dL (ref 32.0–36.0)
MCV: 84.6 fL (ref 80.0–100.0)
MPV: 9.3 fL (ref 7.5–12.5)
Monocytes Relative: 12.4 %
Neutro Abs: 3597 cells/uL (ref 1500–7800)
Neutrophils Relative %: 54.5 %
Platelets: 441 10*3/uL — ABNORMAL HIGH (ref 140–400)
RBC: 4.29 10*6/uL (ref 3.80–5.10)
RDW: 13.7 % (ref 11.0–15.0)
Total Lymphocyte: 29.3 %
WBC: 6.6 10*3/uL (ref 3.8–10.8)

## 2021-11-07 LAB — TSH: TSH: 0.01 mIU/L — ABNORMAL LOW (ref 0.40–4.50)

## 2021-12-31 ENCOUNTER — Other Ambulatory Visit: Payer: Self-pay | Admitting: Nurse Practitioner

## 2021-12-31 DIAGNOSIS — F411 Generalized anxiety disorder: Secondary | ICD-10-CM

## 2021-12-31 DIAGNOSIS — Z0001 Encounter for general adult medical examination with abnormal findings: Secondary | ICD-10-CM

## 2022-03-19 ENCOUNTER — Other Ambulatory Visit: Payer: Self-pay | Admitting: Internal Medicine

## 2022-03-19 ENCOUNTER — Other Ambulatory Visit: Payer: Self-pay | Admitting: Nurse Practitioner

## 2022-03-19 DIAGNOSIS — F411 Generalized anxiety disorder: Secondary | ICD-10-CM

## 2022-03-19 DIAGNOSIS — L680 Hirsutism: Secondary | ICD-10-CM

## 2022-03-19 DIAGNOSIS — Z0001 Encounter for general adult medical examination with abnormal findings: Secondary | ICD-10-CM

## 2022-03-19 DIAGNOSIS — E88819 Insulin resistance, unspecified: Secondary | ICD-10-CM

## 2022-03-19 NOTE — Telephone Encounter (Signed)
Please schedule her a 6 month follow up in 04/2022

## 2022-06-16 ENCOUNTER — Other Ambulatory Visit: Payer: Self-pay | Admitting: Nurse Practitioner

## 2022-06-16 DIAGNOSIS — Z0001 Encounter for general adult medical examination with abnormal findings: Secondary | ICD-10-CM

## 2022-06-16 DIAGNOSIS — F411 Generalized anxiety disorder: Secondary | ICD-10-CM

## 2022-06-16 DIAGNOSIS — E88819 Insulin resistance, unspecified: Secondary | ICD-10-CM

## 2022-06-16 DIAGNOSIS — L680 Hirsutism: Secondary | ICD-10-CM

## 2022-08-13 ENCOUNTER — Encounter: Payer: Self-pay | Admitting: Nurse Practitioner

## 2022-08-13 ENCOUNTER — Other Ambulatory Visit: Payer: Self-pay | Admitting: Nurse Practitioner

## 2022-08-13 ENCOUNTER — Ambulatory Visit (INDEPENDENT_AMBULATORY_CARE_PROVIDER_SITE_OTHER): Payer: Self-pay | Admitting: Nurse Practitioner

## 2022-08-13 VITALS — BP 160/80 | HR 81 | Resp 18 | Wt 152.4 lb

## 2022-08-13 DIAGNOSIS — F4321 Adjustment disorder with depressed mood: Secondary | ICD-10-CM

## 2022-08-13 DIAGNOSIS — L237 Allergic contact dermatitis due to plants, except food: Secondary | ICD-10-CM

## 2022-08-13 DIAGNOSIS — Z0001 Encounter for general adult medical examination with abnormal findings: Secondary | ICD-10-CM

## 2022-08-13 DIAGNOSIS — I10 Essential (primary) hypertension: Secondary | ICD-10-CM

## 2022-08-13 DIAGNOSIS — R350 Frequency of micturition: Secondary | ICD-10-CM

## 2022-08-13 DIAGNOSIS — F411 Generalized anxiety disorder: Secondary | ICD-10-CM

## 2022-08-13 DIAGNOSIS — R102 Pelvic and perineal pain: Secondary | ICD-10-CM

## 2022-08-13 MED ORDER — NITROFURANTOIN MONOHYD MACRO 100 MG PO CAPS: 100.0000 mg | ORAL_CAPSULE | Freq: Two times a day (BID) | ORAL | 0 refills | Status: AC

## 2022-08-13 MED ORDER — PREDNISONE 10 MG PO TABS
ORAL_TABLET | ORAL | 0 refills | Status: DC
Start: 2022-08-13 — End: 2023-04-06

## 2022-08-13 MED ORDER — BUPROPION HCL ER (XL) 150 MG PO TB24
150.0000 mg | ORAL_TABLET | Freq: Two times a day (BID) | ORAL | 2 refills | Status: DC
Start: 2022-08-13 — End: 2022-09-09

## 2022-08-13 NOTE — Progress Notes (Signed)
Assessment and Plan:  Crystal Guzman was seen today for an episodic visit.  Diagnoses and all order for this visit:  Suprapubic pressure/frequency UA with Culture Pending Discussed starting abx tmt if + UTI Stay well hydrated to keep urinary system well flushed   - Urinalysis, Routine w reflex microscopic - Urine Culture  Poison ivy Start OTC antihistamine  Start Steroid taper Avoid scratching to prevent increase in infection.  - predniSONE (DELTASONE) 10 MG tablet; 1 tab 3 x day for 2 days, then 1 tab 2 x day for 2 days, then 1 tab 1 x day for 3 days  Dispense: 13 tablet; Refill: 0  Grief Increase Wellbutrin to BID Continue to monitor  Essential HTN Controlled by lifestyle - elevated in clinic; asymptomatic  Discussed DASH (Dietary Approaches to Stop Hypertension) DASH diet is lower in sodium than a typical American diet. Cut back on foods that are high in saturated fat, cholesterol, and trans fats. Eat more whole-grain foods, fish, poultry, and nuts Remain active and exercise as tolerated daily.  Monitor BP at home-Call if greater than 130/80.   Notify office for further evaluation and treatment, questions or concerns if s/s fail to improve. The risks and benefits of my recommendations, as well as other treatment options were discussed with the patient today. Questions were answered.  Further disposition pending results of labs. Discussed med's effects and SE's.    Over 20 minutes of exam, counseling, chart review, and critical decision making was performed.   Future Appointments  Date Time Provider Department Center  11/10/2022  2:00 PM Raynelle Dick, NP [facility] None    ------------------------------------------------------------------------------------------------------------------   HPI BP (!) 160/80   Pulse 81   Resp 18   Wt 152 lb 6.4 oz (69.1 kg)   SpO2 99%   BMI 25.76 kg/m   62 y.o.female presents for evaluation of suprapubic pressure with  frequency over the last few weeks.  States that it feels like the start of a UTI.  She denies fever, N/V, vaginal discharge. She is trying to stay well hydrated.  Reports increase in stress and grief.  Lost her recent partner to an aneurysm 06/2022.  She is currently taking Wellbutrin XL 150 mg.  Has difficulty sleeping.  Continues to have a good support system.    States that she was helping to tear out old wood flooring x1 week ago from a house and came in contact with poison ivy.  She has  rash along her bilateral lower forearms.  She has  not taken any medications or applied any topical creams to the rea.   No past medical history on file.   Not on File  Current Outpatient Medications on File Prior to Visit  Medication Sig   Ascorbic Acid (VITAMIN C) 1000 MG tablet Take 1,000 mg by mouth every other day.   aspirin 81 MG chewable tablet Chew 1 tablet by mouth daily.   buPROPion (WELLBUTRIN XL) 150 MG 24 hr tablet TAKE 1 TABLET BY MOUTH ONCE DAILY FOR  MOOD,  FOCUS,  AND  CONCENTRATION   Calcium Carb-Cholecalciferol (CALCIUM 600-D PO) Take 1 capsule by mouth daily.   Cholecalciferol 125 MCG (5000 UT) TABS Take 2,000 Units by mouth.    Docusate Calcium (STOOL SOFTENER PO) Take by mouth.   metFORMIN (GLUCOPHAGE-XR) 500 MG 24 hr tablet TAKE 1 TABLET BY MOUTH ONCE DAILY FOR  HIRSUTISM   Multiple Vitamin (MULTIVITAMIN) tablet Take 1 tablet by mouth daily.   Red Yeast  Rice 600 MG CAPS Take by mouth. Taking 16109 mg   spironolactone (ALDACTONE) 100 MG tablet TAKE 1 TABLET BY MOUTH ONCE DAILY FOR  HIRSUTISM   thyroid (NP THYROID) 120 MG tablet Take 1 tablet (120 mg total) by mouth daily before breakfast.   No current facility-administered medications on file prior to visit.    ROS: all negative except what is noted in the HPI.   Physical Exam:  BP (!) 160/80   Pulse 81   Resp 18   Wt 152 lb 6.4 oz (69.1 kg)   SpO2 99%   BMI 25.76 kg/m   General Appearance: NAD.  Awake, conversant and  cooperative. Eyes: PERRLA, EOMs intact.  Sclera white.  Conjunctiva without erythema. Sinuses: No frontal/maxillary tenderness.  No nasal discharge. Nares patent.  ENT/Mouth: Ext aud canals clear.  Bilateral TMs w/DOL and without erythema or bulging. Hearing intact.  Posterior pharynx without swelling or exudate.  Tonsils without swelling or erythema.  Neck: Supple.  No masses, nodules or thyromegaly. Respiratory: Effort is regular with non-labored breathing. Breath sounds are equal bilaterally without rales, rhonchi, wheezing or stridor.  Cardio: RRR with no MRGs. Brisk peripheral pulses without edema.  Abdomen: Active BS in all four quadrants.  Soft and non-tender without guarding, rebound tenderness, hernias or masses. Lymphatics: Non tender without lymphadenopathy.  Musculoskeletal: Full ROM, 5/5 strength, normal ambulation.  No clubbing or cyanosis. Skin: BUE forearms with scattered vesicle type rash with underlying erythema that radiates from elbow to wrist.  Warm.  Appropriate color for ethnicity. Appropriate color for ethnicity. Warm without rashes, lesions, ecchymosis, ulcers.  Neuro: CN II-XII grossly normal. Normal muscle tone without cerebellar symptoms and intact sensation.   Psych: AO X 3,  appropriate mood and affect, insight and judgment.     Adela Glimpse, NP 11:40 AM [facility]

## 2022-08-13 NOTE — Patient Instructions (Signed)
Poison Ivy Dermatitis Poison ivy dermatitis is redness and soreness of the skin caused by chemicals in the leaves of the poison ivy plant. You may have very bad itching, swelling, a rash, and blisters. What are the causes? Touching a poison ivy plant. Touching something that has the chemical on it. This may include animals or objects that have come in contact with the plant. What increases the risk? Going outdoors often in wooded or [place] areas. Going outdoors without wearing protective clothing, such as closed shoes, long pants, and a long-sleeved shirt. What are the signs or symptoms?  Skin redness. Very bad itching. A rash that often includes bumps and blisters. The rash usually appears 48 hours after exposure, if you have had it before. If this is the first time you have it, the rash may not appear until a week after exposure. Swelling. This may occur if the reaction is very bad. Symptoms usually last for 1-2 weeks. The first time you get this condition, symptoms may last 3-4 weeks. How is this treated? This condition may be treated with: Hydrocortisone cream or calamine lotion to relieve itching. Oatmeal baths to soothe the skin. Medicines, such as over-the-counter antihistamine tablets. Oral steroid medicine for very bad reactions. Follow these instructions at home: Medicines Take or apply over-the-counter and prescription medicines only as told by your doctor. Use hydrocortisone cream or calamine lotion as needed to help with itching. General instructions Do not scratch or rub your skin. Put a cold, wet cloth (cold compress) on the affected areas or take baths in cool water. This will help with itching. Avoid hot baths and showers. Take oatmeal baths as needed. Use colloidal oatmeal. You can get this at a pharmacy or grocery store. Follow the instructions on the package. While you have the rash, wash your clothes right after you wear them. Check the affected area every day  for signs of infection. Check for: More redness, swelling, or pain. Fluid or blood. Warmth. Pus or a bad smell. Keep all follow-up visits. Your doctor may want to see how your skin is doing with treatment. How is this prevented?  Know what poison ivy looks like, so you can avoid it. This plant has three leaves with flowering branches on a single stem. The leaves are glossy. The leaves have uneven edges that come to a point. If you touch poison ivy, wash your skin with soap and water right away. Be sure to wash under your fingernails. When hiking or camping, wear long pants, a long-sleeved shirt, long socks, and hiking boots. You can also use a lotion on your skin that helps to prevent contact with poison ivy. If you think that your clothes or outdoor gear came in contact with poison ivy, rinse them off with a garden hose before you bring them inside your house. When doing yard work or gardening, wear gloves, long sleeves, long pants, and boots. Wash your garden tools and gloves if they come in contact with poison ivy. If you think that your pet has come into contact with poison ivy, wash them with pet shampoo and water. Make sure to wear gloves while washing your pet. Contact a doctor if: You have open sores in the rash area. You have any signs of infection. You have redness that spreads past the rash area. You have a fever. You have a rash over a large area of your body. You have a rash on your eyes, mouth, or genitals. Your rash does not get better after  a few weeks. Get help right away if: Your face swells or your eyes swell shut. You have trouble breathing. You have trouble swallowing. These symptoms may be an emergency. Do not wait to see if the symptoms will go away. Get help right away. Call 911. This information is not intended to replace advice given to you by your health care provider. Make sure you discuss any questions you have with your health care provider. Document  Revised: 08/01/2021 Document Reviewed: 08/01/2021 Elsevier Patient Education  2024 [employer].

## 2022-08-14 LAB — URINALYSIS, ROUTINE W REFLEX MICROSCOPIC
Bilirubin Urine: NEGATIVE
Glucose, UA: NEGATIVE
Hyaline Cast: NONE SEEN /LPF
Nitrite: NEGATIVE
Protein, ur: NEGATIVE
pH: 5.5 (ref 5.0–8.0)

## 2022-08-14 LAB — URINE CULTURE
MICRO NUMBER:: 15015197
SPECIMEN QUALITY:: ADEQUATE

## 2022-08-14 LAB — MICROSCOPIC MESSAGE

## 2022-08-16 LAB — URINALYSIS, ROUTINE W REFLEX MICROSCOPIC
Hgb urine dipstick: NEGATIVE
Ketones, ur: NEGATIVE
RBC / HPF: NONE SEEN /HPF (ref 0–2)
Specific Gravity, Urine: 1.012 (ref 1.001–1.035)

## 2022-08-16 LAB — URINE CULTURE

## 2022-09-09 ENCOUNTER — Other Ambulatory Visit: Payer: Self-pay

## 2022-09-09 DIAGNOSIS — F411 Generalized anxiety disorder: Secondary | ICD-10-CM

## 2022-09-09 DIAGNOSIS — Z0001 Encounter for general adult medical examination with abnormal findings: Secondary | ICD-10-CM

## 2022-09-09 MED ORDER — BUPROPION HCL ER (XL) 150 MG PO TB24
150.0000 mg | ORAL_TABLET | Freq: Two times a day (BID) | ORAL | 2 refills | Status: DC
Start: 2022-09-09 — End: 2023-05-12

## 2022-09-19 ENCOUNTER — Other Ambulatory Visit: Payer: Self-pay | Admitting: Nurse Practitioner

## 2022-09-19 DIAGNOSIS — E039 Hypothyroidism, unspecified: Secondary | ICD-10-CM

## 2022-09-29 ENCOUNTER — Other Ambulatory Visit: Payer: Self-pay | Admitting: Nurse Practitioner

## 2022-09-29 DIAGNOSIS — L680 Hirsutism: Secondary | ICD-10-CM

## 2022-09-29 DIAGNOSIS — E88819 Insulin resistance, unspecified: Secondary | ICD-10-CM

## 2022-09-30 ENCOUNTER — Telehealth: Payer: Self-pay | Admitting: Nurse Practitioner

## 2022-09-30 ENCOUNTER — Other Ambulatory Visit: Payer: Self-pay | Admitting: Nurse Practitioner

## 2022-09-30 DIAGNOSIS — E88819 Insulin resistance, unspecified: Secondary | ICD-10-CM

## 2022-09-30 DIAGNOSIS — L680 Hirsutism: Secondary | ICD-10-CM

## 2022-09-30 MED ORDER — METFORMIN HCL ER 500 MG PO TB24
ORAL_TABLET | ORAL | 2 refills | Status: DC
Start: 2022-09-30 — End: 2023-04-06

## 2022-09-30 NOTE — Telephone Encounter (Signed)
Please call at [phone]. Metformin refused per pharmacy, [facility]. Please call and advise why.

## 2022-09-30 NOTE — Telephone Encounter (Signed)
I sent script in- I cannot see where it was refused a refill

## 2022-10-02 ENCOUNTER — Other Ambulatory Visit: Payer: Self-pay | Admitting: Nurse Practitioner

## 2022-10-02 DIAGNOSIS — L680 Hirsutism: Secondary | ICD-10-CM

## 2022-11-10 ENCOUNTER — Encounter: Payer: Self-pay | Admitting: Nurse Practitioner

## 2023-04-06 ENCOUNTER — Other Ambulatory Visit: Payer: Self-pay

## 2023-04-06 ENCOUNTER — Encounter (HOSPITAL_BASED_OUTPATIENT_CLINIC_OR_DEPARTMENT_OTHER): Payer: Self-pay | Admitting: Orthopedic Surgery

## 2023-04-06 NOTE — H&P (Signed)
PREOPERATIVE H&P  Chief Complaint: LEFT KNEE MEDIAL MENISCUS TEAR  HPI: Crystal Guzman is a 63 y.o. female who presents with a diagnosis of LEFT KNEE MEDIAL MENISCUS TEAR. Symptoms are rated as moderate to severe, and have been worsening.  This is significantly impairing activities of daily living.  She has elected for surgical management.   Past Medical History:  Diagnosis Date   Fibromyalgia    Hashimoto's disease    Peripheral neuropathy    Past Surgical History:  Procedure Laterality Date   ABDOMINAL HYSTERECTOMY  1994   CARPAL TUNNEL WITH CUBITAL TUNNEL Bilateral 2002   right hand 02   TONSILECTOMY/ADENOIDECTOMY WITH MYRINGOTOMY  1967   TOTAL THYROIDECTOMY  2001   Social History   Socioeconomic History   Marital status: Divorced    Spouse name: Not on file   Number of children: Not on file   Years of education: Not on file   Highest education level: Not on file  Occupational History   Not on file  Tobacco Use   Smoking status: Former    Current packs/day: 0.00    Types: Cigarettes    Start date: 11/06/1989    Quit date: 11/07/1999    Years since quitting: 23.4   Smokeless tobacco: Never  Substance and Sexual Activity   Alcohol use: Never   Drug use: Never   Sexual activity: Not Currently  Other Topics Concern   Not on file  Social History Narrative   Not on file   Social Drivers of Health   Financial Resource Strain: Not on file  Food Insecurity: Not on file  Transportation Needs: Not on file  Physical Activity: Not on file  Stress: Not on file  Social Connections: Not on file   Family History  Problem Relation Age of Onset   Lung cancer Mother    Colon cancer Mother    Angina Mother    Hypertension Mother    Colitis Mother    Arthritis Mother    Stroke Father    Hypertension Father    Migraines Father    Heart attack Father    Heart disease Father        [address] way bypass   Arthritis Father    Gout Father    Tuberculosis Sister    No Known  Allergies Prior to Admission medications   Medication Sig Start Date End Date Taking? Authorizing Provider  ALPRAZolam Prudy Feeler) 1 MG tablet Take 1 mg by mouth at bedtime as needed for anxiety.   Yes [provider]  cyclobenzaprine (FLEXERIL) 10 MG tablet Take 10 mg by mouth 3 (three) times daily as needed for muscle spasms.   Yes [provider]  Ascorbic Acid (VITAMIN C) 1000 MG tablet Take 1,000 mg by mouth every other day.   Yes [provider]  aspirin 81 MG chewable tablet Chew 1 tablet by mouth daily.   Yes [provider]  buPROPion (WELLBUTRIN XL) 150 MG 24 hr tablet Take 1 tablet (150 mg total) by mouth in the morning and at bedtime. 09/09/22  Yes Cranford, Archie Patten, NP  Calcium Carb-Cholecalciferol (CALCIUM 600-D PO) Take 1 capsule by mouth daily.   Yes [provider]  Cholecalciferol 125 MCG (5000 UT) TABS Take 2,000 Units by mouth.    Yes [provider]  Docusate Calcium (STOOL SOFTENER PO) Take by mouth.   Yes [provider]  Multiple Vitamin (MULTIVITAMIN) tablet Take 1 tablet by mouth daily.   Yes [provider]  Red Yeast Rice 600 MG CAPS Take by mouth. Taking 16109 mg   Yes [provider]  thyroid (NP THYROID) 120 MG tablet Take  1 tablet  Daily  on an empty stomach with only water for 30 minutes & no Antacid meds, Calcium or Magnesium for 4 hours & avoid Biotin 09/19/22  Yes Lucky Cowboy, MD     Positive ROS: All other systems have been reviewed and were otherwise negative with the exception of those mentioned in the HPI and as above.  Physical Exam: General: Alert, no acute distress Cardiovascular: No pedal edema Respiratory: No cyanosis, no use of accessory musculature GI: No organomegaly, abdomen is soft and non-tender Skin: No lesions in the area of chief complaint Neurologic: Sensation intact distally Psychiatric: Patient is competent for consent with normal mood and affect Lymphatic:  No axillary or cervical lymphadenopathy  MUSCULOSKELETAL: TTP medial joint line, ROM 0-90, mild effusion, + flexion pinch, NVI   Imaging: MRI of the left knee shows a complex tear of the posterior 80% of the body of the medial meniscus and multiple tears in the inferior articular surface of the posterior horn   Assessment: LEFT KNEE MEDIAL MENISCUS TEAR  Plan: Plan for Procedure(s): KNEE ARTHROSCOPY WITH MEDIAL MENISECTOMY  The risks benefits and alternatives were discussed with the patient including but not limited to the risks of nonoperative treatment, versus surgical intervention including infection, bleeding, nerve injury,  blood clots, cardiopulmonary complications, morbidity, mortality, among others, and they were willing to proceed.   Weightbearing: WBAT LLE Orthopedic devices: none Showering: POD 3 Dressing: reinforce PRN Medicines: ASA, Norco, Celebrex, Zofran  Discharge: home Follow up: 04/24/23 at 11:15am    Jenne Pane, PA-C Office [phone] 04/06/2023 4:59 PM

## 2023-04-13 NOTE — Anesthesia Preprocedure Evaluation (Signed)
Anesthesia Evaluation  Patient identified by MRN, date of birth, ID band Patient awake    Reviewed: Allergy & Precautions, NPO status , Patient's Chart, lab work & pertinent test results  History of Anesthesia Complications Negative for: history of anesthetic complications  Airway Mallampati: II  TM Distance: >3 FB Neck ROM: Full    Dental  (+) Dental Advisory Given   Pulmonary neg shortness of breath, neg sleep apnea, neg COPD, neg recent URI, former smoker   Pulmonary exam normal breath sounds clear to auscultation       Cardiovascular hypertension, (-) angina (-) Past MI, (-) Cardiac Stents and (-) CABG (-) dysrhythmias  Rhythm:Regular Rate:Normal  HLD   Neuro/Psych neg Seizures PSYCHIATRIC DISORDERS Anxiety      Neuromuscular disease (peripheral neuropathy)    GI/Hepatic negative GI ROS, Neg liver ROS,,,  Endo/Other  neg diabetesHypothyroidism    Renal/GU negative Renal ROS     Musculoskeletal  (+)  Fibromyalgia -  Abdominal   Peds  Hematology negative hematology ROS (+)   Anesthesia Other Findings   Reproductive/Obstetrics                             Anesthesia Physical Anesthesia Plan  ASA: 2  Anesthesia Plan: General   Post-op Pain Management: Tylenol PO (pre-op)*   Induction: Intravenous  PONV Risk Score and Plan: 3 and Ondansetron, Dexamethasone, Midazolam and Treatment may vary due to age or medical condition  Airway Management Planned: LMA  Additional Equipment:   Intra-op Plan:   Post-operative Plan: Extubation in OR  Informed Consent: I have reviewed the patients History and Physical, chart, labs and discussed the procedure including the risks, benefits and alternatives for the proposed anesthesia with the patient or authorized representative who has indicated his/her understanding and acceptance.     Dental advisory given  Plan Discussed with: CRNA and  Anesthesiologist  Anesthesia Plan Comments: (Risks of general anesthesia discussed including, but not limited to, sore throat, hoarse voice, chipped/damaged teeth, injury to vocal cords, nausea and vomiting, allergic reactions, lung infection, heart attack, stroke, and death. All questions answered. )        Anesthesia Quick Evaluation

## 2023-04-13 NOTE — Progress Notes (Signed)

## 2023-04-14 ENCOUNTER — Encounter (HOSPITAL_BASED_OUTPATIENT_CLINIC_OR_DEPARTMENT_OTHER)
Admission: RE | Disposition: A | Discharge status: Home / Self Care | Payer: Self-pay | Source: Home / Self Care | Attending: Orthopedic Surgery

## 2023-04-14 ENCOUNTER — Ambulatory Visit (HOSPITAL_BASED_OUTPATIENT_CLINIC_OR_DEPARTMENT_OTHER)
Admission: RE | Admit: 2023-04-14 | Discharge: 2023-04-14 | Disposition: A | Discharge status: Home / Self Care | Payer: Medicaid Other | Attending: Orthopedic Surgery | Admitting: Orthopedic Surgery

## 2023-04-14 ENCOUNTER — Ambulatory Visit (HOSPITAL_BASED_OUTPATIENT_CLINIC_OR_DEPARTMENT_OTHER): Payer: Self-pay | Admitting: Anesthesiology

## 2023-04-14 ENCOUNTER — Encounter (HOSPITAL_BASED_OUTPATIENT_CLINIC_OR_DEPARTMENT_OTHER): Payer: Self-pay | Admitting: Orthopedic Surgery

## 2023-04-14 DIAGNOSIS — E890 Postprocedural hypothyroidism: Secondary | ICD-10-CM | POA: Diagnosis not present

## 2023-04-14 DIAGNOSIS — M797 Fibromyalgia: Secondary | ICD-10-CM | POA: Insufficient documentation

## 2023-04-14 DIAGNOSIS — X58XXXA Exposure to other specified factors, initial encounter: Secondary | ICD-10-CM | POA: Diagnosis not present

## 2023-04-14 DIAGNOSIS — I10 Essential (primary) hypertension: Secondary | ICD-10-CM | POA: Diagnosis not present

## 2023-04-14 DIAGNOSIS — F419 Anxiety disorder, unspecified: Secondary | ICD-10-CM | POA: Diagnosis not present

## 2023-04-14 DIAGNOSIS — Z87891 Personal history of nicotine dependence: Secondary | ICD-10-CM | POA: Insufficient documentation

## 2023-04-14 DIAGNOSIS — S83272A Complex tear of lateral meniscus, current injury, left knee, initial encounter: Secondary | ICD-10-CM | POA: Diagnosis not present

## 2023-04-14 DIAGNOSIS — S83242A Other tear of medial meniscus, current injury, left knee, initial encounter: Secondary | ICD-10-CM | POA: Diagnosis present

## 2023-04-14 DIAGNOSIS — M2342 Loose body in knee, left knee: Secondary | ICD-10-CM | POA: Insufficient documentation

## 2023-04-14 DIAGNOSIS — G629 Polyneuropathy, unspecified: Secondary | ICD-10-CM | POA: Insufficient documentation

## 2023-04-14 DIAGNOSIS — S83232A Complex tear of medial meniscus, current injury, left knee, initial encounter: Secondary | ICD-10-CM | POA: Diagnosis not present

## 2023-04-14 DIAGNOSIS — E785 Hyperlipidemia, unspecified: Secondary | ICD-10-CM | POA: Insufficient documentation

## 2023-04-14 HISTORY — DX: Autoimmune thyroiditis: E06.3

## 2023-04-14 HISTORY — DX: Fibromyalgia: M79.7

## 2023-04-14 HISTORY — DX: Polyneuropathy, unspecified: G62.9

## 2023-04-14 HISTORY — PX: KNEE ARTHROSCOPY WITH MEDIAL MENISECTOMY: SHX5651

## 2023-04-14 SURGERY — ARTHROSCOPY, KNEE, WITH MEDIAL MENISCECTOMY
Anesthesia: General | Site: Knee | Laterality: Left

## 2023-04-14 MED ORDER — DEXAMETHASONE SODIUM PHOSPHATE 10 MG/ML IJ SOLN
INTRAMUSCULAR | Status: AC
  Filled 2023-04-14: qty 1

## 2023-04-14 MED ORDER — ACETAMINOPHEN 500 MG PO TABS
ORAL_TABLET | ORAL | Status: AC
  Filled 2023-04-14: qty 2

## 2023-04-14 MED ORDER — GLYCOPYRROLATE PF 0.2 MG/ML IJ SOSY
PREFILLED_SYRINGE | INTRAMUSCULAR | Status: AC
  Filled 2023-04-14: qty 1

## 2023-04-14 MED ORDER — LACTATED RINGERS IV SOLN: INTRAVENOUS | Status: DC

## 2023-04-14 MED ORDER — MIDAZOLAM HCL 2 MG/2ML IJ SOLN
INTRAMUSCULAR | Status: AC
  Filled 2023-04-14: qty 2

## 2023-04-14 MED ORDER — FENTANYL CITRATE (PF) 100 MCG/2ML IJ SOLN
INTRAMUSCULAR | Status: DC | PRN
  Administered 2023-04-14 (×3): 50 ug via INTRAVENOUS

## 2023-04-14 MED ORDER — ACETAMINOPHEN 500 MG PO TABS: 1000.0000 mg | ORAL_TABLET | Freq: Once | ORAL | Status: AC

## 2023-04-14 MED ORDER — AMISULPRIDE (ANTIEMETIC) 5 MG/2ML IV SOLN
10.0000 mg | Freq: Once | INTRAVENOUS | Status: DC | PRN
Start: 2023-04-14 — End: 2023-04-14

## 2023-04-14 MED ORDER — HYDROCODONE-ACETAMINOPHEN 10-325 MG PO TABS: 1.0000 | ORAL_TABLET | Freq: Four times a day (QID) | ORAL | 0 refills | Status: AC | PRN

## 2023-04-14 MED ORDER — OXYCODONE HCL 5 MG/5ML PO SOLN: 5.0000 mg | Freq: Once | ORAL | Status: DC | PRN

## 2023-04-14 MED ORDER — CELECOXIB 200 MG PO CAPS: 200.0000 mg | ORAL_CAPSULE | Freq: Two times a day (BID) | ORAL | 0 refills | Status: AC | PRN

## 2023-04-14 MED ORDER — LIDOCAINE 2% (20 MG/ML) 5 ML SYRINGE
INTRAMUSCULAR | Status: AC
  Filled 2023-04-14: qty 5

## 2023-04-14 MED ORDER — FENTANYL CITRATE (PF) 100 MCG/2ML IJ SOLN
INTRAMUSCULAR | Status: AC
  Filled 2023-04-14: qty 2

## 2023-04-14 MED ORDER — DEXAMETHASONE SODIUM PHOSPHATE 4 MG/ML IJ SOLN
INTRAMUSCULAR | Status: DC | PRN
  Administered 2023-04-14: 10 mg via INTRAVENOUS

## 2023-04-14 MED ORDER — MIDAZOLAM HCL 5 MG/5ML IJ SOLN
INTRAMUSCULAR | Status: DC | PRN
  Administered 2023-04-14: 2 mg via INTRAVENOUS

## 2023-04-14 MED ORDER — LIDOCAINE 2% (20 MG/ML) 5 ML SYRINGE
INTRAMUSCULAR | Status: DC | PRN
  Administered 2023-04-14: 100 mg via INTRAVENOUS

## 2023-04-14 MED ORDER — SODIUM CHLORIDE 0.9 % IV SOLN: INTRAVENOUS | Status: DC | PRN

## 2023-04-14 MED ORDER — ACETAMINOPHEN 500 MG PO TABS
1000.0000 mg | ORAL_TABLET | Freq: Once | ORAL | Status: AC
  Administered 2023-04-14: 1000 mg via ORAL

## 2023-04-14 MED ORDER — OXYCODONE HCL 5 MG PO TABS: 5.0000 mg | ORAL_TABLET | Freq: Once | ORAL | Status: DC | PRN

## 2023-04-14 MED ORDER — ONDANSETRON HCL 4 MG/2ML IJ SOLN
INTRAMUSCULAR | Status: DC | PRN
  Administered 2023-04-14: 4 mg via INTRAVENOUS

## 2023-04-14 MED ORDER — ONDANSETRON 4 MG PO TBDP: 4.0000 mg | ORAL_TABLET | Freq: Three times a day (TID) | ORAL | 0 refills | Status: AC | PRN

## 2023-04-14 MED ORDER — PROPOFOL 10 MG/ML IV BOLUS
INTRAVENOUS | Status: DC | PRN
  Administered 2023-04-14: 150 mg via INTRAVENOUS

## 2023-04-14 MED ORDER — FENTANYL CITRATE (PF) 100 MCG/2ML IJ SOLN: 25.0000 ug | INTRAMUSCULAR | Status: DC | PRN

## 2023-04-14 MED ORDER — DEXAMETHASONE SODIUM PHOSPHATE 10 MG/ML IJ SOLN
8.0000 mg | Freq: Once | INTRAMUSCULAR | Status: DC
Start: 2023-04-14 — End: 2023-04-14

## 2023-04-14 MED ORDER — POVIDONE-IODINE 10 % EX SWAB
2.0000 | Freq: Once | CUTANEOUS | Status: DC
Start: 2023-04-14 — End: 2023-04-14

## 2023-04-14 MED ORDER — CEFAZOLIN SODIUM-DEXTROSE 2-4 GM/100ML-% IV SOLN
2.0000 g | INTRAVENOUS | Status: AC
  Administered 2023-04-14: 2 g via INTRAVENOUS

## 2023-04-14 MED ORDER — ONDANSETRON HCL 4 MG/2ML IJ SOLN
INTRAMUSCULAR | Status: AC
  Filled 2023-04-14: qty 2

## 2023-04-14 MED ORDER — SODIUM CHLORIDE 0.9 % IR SOLN
Status: DC | PRN
  Administered 2023-04-14: 3000 mL

## 2023-04-14 MED ORDER — CEFAZOLIN SODIUM-DEXTROSE 2-4 GM/100ML-% IV SOLN
INTRAVENOUS | Status: AC
  Filled 2023-04-14: qty 100

## 2023-04-14 SURGICAL SUPPLY — 32 items
BNDG ELASTIC 6INX 5YD STR LF (GAUZE/BANDAGES/DRESSINGS) ×1 IMPLANT
CHLORAPREP W/TINT 26 (MISCELLANEOUS) ×1 IMPLANT
CUFF TRNQT CYL 34X4.125X (TOURNIQUET CUFF) ×1 IMPLANT
DISSECTOR 3.8MM X 13CM (MISCELLANEOUS) ×1 IMPLANT
DISSECTOR 4.0MM X 13CM (MISCELLANEOUS) IMPLANT
DRAPE ARTHROSCOPY W/POUCH 90 (DRAPES) ×1 IMPLANT
DRAPE IMP U-DRAPE 54X76 (DRAPES) ×1 IMPLANT
DRAPE U-SHAPE 47X51 STRL (DRAPES) ×1 IMPLANT
DRSG EMULSION OIL 3X3 NADH (GAUZE/BANDAGES/DRESSINGS) ×1 IMPLANT
EXCALIBUR 3.8MM X 13CM (MISCELLANEOUS) IMPLANT
GAUZE PAD ABD 8X10 STRL (GAUZE/BANDAGES/DRESSINGS) ×2 IMPLANT
GAUZE SPONGE 4X4 12PLY STRL (GAUZE/BANDAGES/DRESSINGS) ×1 IMPLANT
GLOVE BIO SURGEON STRL SZ7.5 (GLOVE) ×1 IMPLANT
GLOVE BIOGEL PI IND STRL 7.5 (GLOVE) ×2 IMPLANT
GLOVE BIOGEL PI IND STRL 8 (GLOVE) ×1 IMPLANT
GLOVE BIOGEL PI IND STRL 8.5 (GLOVE) IMPLANT
GLOVE SURG SYN 7.5 E (GLOVE) ×1 IMPLANT
GLOVE SURG SYN 7.5 PF PI (GLOVE) ×1 IMPLANT
GOWN STRL REUS W/ TWL LRG LVL3 (GOWN DISPOSABLE) ×1 IMPLANT
GOWN STRL REUS W/ TWL XL LVL3 (GOWN DISPOSABLE) ×1 IMPLANT
MANIFOLD NEPTUNE II (INSTRUMENTS) ×1 IMPLANT
PACK ARTHROSCOPY DSU (CUSTOM PROCEDURE TRAY) ×1 IMPLANT
PACK BASIN DAY SURGERY FS (CUSTOM PROCEDURE TRAY) ×1 IMPLANT
PACK ICE MAXI GEL EZY WRAP (MISCELLANEOUS) ×1 IMPLANT
SLEEVE SCD COMPRESS KNEE MED (STOCKING) ×1 IMPLANT
SUT ETHILON 3 0 PS 1 (SUTURE) ×1 IMPLANT
TAPE CLOTH 3X10 TAN LF (GAUZE/BANDAGES/DRESSINGS) IMPLANT
TOWEL GREEN STERILE FF (TOWEL DISPOSABLE) ×1 IMPLANT
TUBING ARTHROSCOPY IRRIG 16FT (MISCELLANEOUS) ×1 IMPLANT
WAND ABLATOR APOLLO I90 (BUR) IMPLANT
WATER STERILE IRR 1000ML POUR (IV SOLUTION) ×1 IMPLANT
WRAP KNEE MAXI GEL POST OP (GAUZE/BANDAGES/DRESSINGS) ×1 IMPLANT

## 2023-04-14 NOTE — Anesthesia Procedure Notes (Signed)
Procedure Name: LMA Insertion Date/Time: 04/14/2023 8:30 AM  Performed by: Caren Macadam, CRNAPre-anesthesia Checklist: Patient identified, Emergency Drugs available, Suction available and Patient being monitored Patient Re-evaluated:Patient Re-evaluated prior to induction Oxygen Delivery Method: Circle system utilized Preoxygenation: Pre-oxygenation with 100% oxygen Induction Type: IV induction Ventilation: Mask ventilation without difficulty LMA: LMA inserted LMA Size: 4.0 Number of attempts: 1 Placement Confirmation: positive ETCO2 and breath sounds checked- equal and bilateral Tube secured with: Tape Dental Injury: Teeth and Oropharynx as per pre-operative assessment

## 2023-04-14 NOTE — Interval H&P Note (Signed)
History and Physical Interval Note:  04/14/2023 6:53 AM  Crystal Guzman  has presented today for surgery, with the diagnosis of LEFT KNEE MEDIAL MENISCUS TEAR.  The various methods of treatment have been discussed with the patient and family. After consideration of risks, benefits and other options for treatment, the patient has consented to  Procedure(s): KNEE ARTHROSCOPY WITH MEDIAL MENISECTOMY (Left) as a surgical intervention.  The patient's history has been reviewed, patient examined, no change in status, stable for surgery.  I have reviewed the patient's chart and labs.  Questions were answered to the patient's satisfaction.     Sheral Apley

## 2023-04-14 NOTE — Anesthesia Postprocedure Evaluation (Signed)
Anesthesia Post Note  Patient: Crystal Guzman  Procedure(s) Performed: KNEE ARTHROSCOPY WITH MEDIAL MENISECTOMY and Lateral Menisectomy and Removal of Loose Bodies (Left: Knee)     Patient location during evaluation: PACU Anesthesia Type: General Level of consciousness: awake Pain management: pain level controlled Vital Signs Assessment: post-procedure vital signs reviewed and stable Respiratory status: spontaneous breathing, nonlabored ventilation and respiratory function stable Cardiovascular status: blood pressure returned to baseline and stable Postop Assessment: no apparent nausea or vomiting Anesthetic complications: no   No notable events documented.  Last Vitals:  Vitals:   04/14/23 0915 04/14/23 0929  BP: (!) 169/87 (!) 159/88  Pulse: 93   Resp: 20 17  Temp:  36.6 C  SpO2: 99% 98%    Last Pain:  Vitals:   04/14/23 0929  TempSrc:   PainSc: 0-No pain                 Linton Rump

## 2023-04-14 NOTE — Discharge Instructions (Addendum)
POST-OPERATIVE OPIOID TAPER INSTRUCTIONS: It is important to wean off of your opioid medication as soon as possible. If you do not need pain medication after your surgery it is ok to stop day one. Opioids include: Codeine, Hydrocodone(Norco, Vicodin), Oxycodone(Percocet, oxycontin) and hydromorphone amongst others.  Long term and even short term use of opiods can cause: Increased pain response Dependence Constipation Depression Respiratory depression And more.  Withdrawal symptoms can include Flu like symptoms Nausea, vomiting And more Techniques to manage these symptoms Hydrate well Eat regular healthy meals Stay active Use relaxation techniques(deep breathing, meditating, yoga) Do Not substitute Alcohol to help with tapering If you have been on opioids for less than two weeks and do not have pain than it is ok to stop all together.  Plan to wean off of opioids This plan should start within one week post op of your joint replacement. Maintain the same interval or time between taking each dose and first decrease the dose.  Cut the total daily intake of opioids by one tablet each day Next start to increase the time between doses. The last dose that should be eliminated is the evening dose.     Post Anesthesia Home Care Instructions  Activity: Get plenty of rest for the remainder of the day. A responsible individual must stay with you for 24 hours following the procedure.  For the next 24 hours, DO NOT: -Drive a car -Advertising copywriter -Drink alcoholic beverages -Take any medication unless instructed by your physician -Make any legal decisions or sign important papers.  Meals: Start with liquid foods such as gelatin or soup. Progress to regular foods as tolerated. Avoid greasy, spicy, heavy foods. If nausea and/or vomiting occur, drink only clear liquids until the nausea and/or vomiting subsides. Call your physician if vomiting continues.  Special Instructions/Symptoms: Your  throat may feel dry or sore from the anesthesia or the breathing tube placed in your throat during surgery. If this causes discomfort, gargle with warm salt water. The discomfort should disappear within 24 hours.  Tylenol can be taken 1:15 pm if needed

## 2023-04-14 NOTE — Transfer of Care (Signed)
Immediate Anesthesia Transfer of Care Note  Patient: Crystal Guzman  Procedure(s) Performed: KNEE ARTHROSCOPY WITH MEDIAL MENISECTOMY and Lateral Menisectomy and Removal of Loose Bodies (Left: Knee)  Patient Location: PACU  Anesthesia Type:General  Level of Consciousness: awake  Airway & Oxygen Therapy: Patient Spontanous Breathing and Patient connected to face mask oxygen  Post-op Assessment: Report given to RN and Post -op Vital signs reviewed and stable  Post vital signs: Reviewed and stable  Last Vitals:  Vitals Value Taken Time  BP 162/83 04/14/23 0905  Temp 36.2 C 04/14/23 0905  Pulse 89 04/14/23 0906  Resp 8 04/14/23 0906  SpO2 98 % 04/14/23 0906  Vitals shown include unfiled device data.  Last Pain:  Vitals:   04/14/23 0712  TempSrc: Temporal  PainSc: 2       Patients Stated Pain Goal: 3 (04/14/23 4098)  Complications: No notable events documented.

## 2023-04-14 NOTE — Op Note (Signed)
04/14/2023  8:57 AM  PATIENT:  Crystal Guzman    PRE-OPERATIVE DIAGNOSIS:  LEFT KNEE MEDIAL MENISCUS TEAR  POST-OPERATIVE DIAGNOSIS:  Same  PROCEDURE:  KNEE ARTHROSCOPY WITH MEDIAL MENISECTOMY and Lateral Menisectomy and Removal of Loose Bodies  SURGEON:  Sheral Apley, MD  ASSISTANT: Levester Fresh, PA-C, he was present and scrubbed throughout the case, critical for completion in a timely fashion, and for retraction, instrumentation, and closure.   ANESTHESIA:   General  BLOOD LOSS: min  COMPLICATIONS: None   PREOPERATIVE INDICATIONS:  [name] is a  63 y.o. female with a diagnosis of LEFT KNEE MEDIAL MENISCUS TEAR who failed conservative measures and elected for surgical management.    The risks benefits and alternatives were discussed with the patient preoperatively including but not limited to the risks of infection, bleeding, nerve injury, cardiopulmonary complications, the need for revision surgery, among others, and the patient was willing to proceed.  OPERATIVE IMPLANTS: nonne  OPERATIVE FINDINGS: Examination under anesthesia: stable Diagnostic Arthroscopy:  articular cartilage:pf grade 1, Med grade 1/2 Medial meniscus:complex tear Lateral meniscus:complex tear Anterior cruciate ligament/PCL: stable Loose bodies: med loose body removed with a grabber frmo an expanded medial portal    OPERATIVE PROCEDURE:  Patient was identified in the preoperative holding area and site was marked by me female was transported to the operating theater and placed on the table in supine position taking care to pad all bony prominences. After a preincinduction time out anesthesia was induced.  female received ancef for preoperative antibiotics. The left lower extremity was prepped and draped in normal sterile fashion and a pre-incision timeout was performed.   A small stab incision was made in the anterolateral portal position. The arthroscope was introduced in the joint. A  medial portal was then established under direct visualization just above the anterior horn of the medial meniscus. Diagnostic arthroscopy was then carried out with findings as described above.  I debried the chondral surface of the PF space and MFC.  This was a chondroplasty of the patellofemoral space and medial femoral condyle  Next I noted complex tearing of the lateral meniscus I debrided this with a shaver performing a partial meniscectomy of the lateral side  There was a large loose body noted in the posterior lateral aspect of the knee I removed this with a grabber through an expanded medial portal  I examined the medial side there was complex tearing of the mid body the medial meniscus I debrided this with a shaver and biter to a stable meniscus performing a partial meniscectomy  The arthroscopic equipment was removed from the joint and the portals were closed with 3-0 nylon in an interrupted fashion.  Sterile dressings were then applied including Xeroform 4 x 4's ABDs an ACE bandage.  The patient was then allowed to awaken from general anesthesia, transferred to the stretcher and taken to the recovery room in stable condition.  POSTOPERATIVE PLAN: The patient will be discharged home today and will followup in one week for suture removal and wound check.  VTE prophylaxis: Weightbearing mobilize aspirin

## 2023-04-15 ENCOUNTER — Encounter (HOSPITAL_BASED_OUTPATIENT_CLINIC_OR_DEPARTMENT_OTHER): Payer: Self-pay | Admitting: Orthopedic Surgery

## 2023-05-12 ENCOUNTER — Other Ambulatory Visit: Payer: Self-pay

## 2023-05-12 DIAGNOSIS — F411 Generalized anxiety disorder: Secondary | ICD-10-CM

## 2023-05-12 DIAGNOSIS — Z0001 Encounter for general adult medical examination with abnormal findings: Secondary | ICD-10-CM

## 2023-05-12 MED ORDER — BUPROPION HCL ER (XL) 150 MG PO TB24: 150.0000 mg | ORAL_TABLET | Freq: Two times a day (BID) | ORAL | 0 refills | Status: AC

## 2023-08-14 ENCOUNTER — Other Ambulatory Visit: Payer: Self-pay | Admitting: Family

## 2023-08-14 DIAGNOSIS — Z0001 Encounter for general adult medical examination with abnormal findings: Secondary | ICD-10-CM

## 2023-08-14 DIAGNOSIS — F411 Generalized anxiety disorder: Secondary | ICD-10-CM
